# Patient Record
Sex: Female | Born: 1960 | Race: White | Hispanic: No | Smoking: Former smoker
Health system: Southern US, Community
[De-identification: ages and names within clinical notes are randomized; demographics above are authoritative.]

## PROBLEM LIST (undated history)

## (undated) DIAGNOSIS — N201 Calculus of ureter: Secondary | ICD-10-CM

## (undated) DIAGNOSIS — Z87442 Personal history of urinary calculi: Secondary | ICD-10-CM

## (undated) DIAGNOSIS — Z8719 Personal history of other diseases of the digestive system: Secondary | ICD-10-CM

## (undated) DIAGNOSIS — R112 Nausea with vomiting, unspecified: Secondary | ICD-10-CM

## (undated) DIAGNOSIS — Z973 Presence of spectacles and contact lenses: Secondary | ICD-10-CM

## (undated) DIAGNOSIS — Z9889 Other specified postprocedural states: Secondary | ICD-10-CM

## (undated) DIAGNOSIS — K219 Gastro-esophageal reflux disease without esophagitis: Secondary | ICD-10-CM

## (undated) HISTORY — PX: BREAST EXCISIONAL BIOPSY: SUR124

---

## 1998-05-13 ENCOUNTER — Ambulatory Visit (HOSPITAL_BASED_OUTPATIENT_CLINIC_OR_DEPARTMENT_OTHER): Admission: RE | Admit: 1998-05-13 | Discharge: 1998-05-13 | Payer: Self-pay | Admitting: General Surgery

## 1998-06-08 ENCOUNTER — Other Ambulatory Visit: Admission: RE | Admit: 1998-06-08 | Discharge: 1998-06-08 | Payer: Self-pay | Admitting: Obstetrics and Gynecology

## 1998-12-06 ENCOUNTER — Ambulatory Visit (HOSPITAL_COMMUNITY): Admission: RE | Admit: 1998-12-06 | Discharge: 1998-12-06 | Payer: Self-pay | Admitting: Gastroenterology

## 1999-05-01 ENCOUNTER — Encounter: Admission: RE | Admit: 1999-05-01 | Discharge: 1999-05-01 | Payer: Self-pay | Admitting: Obstetrics and Gynecology

## 1999-05-01 ENCOUNTER — Encounter: Payer: Self-pay | Admitting: Obstetrics and Gynecology

## 1999-06-13 ENCOUNTER — Other Ambulatory Visit: Admission: RE | Admit: 1999-06-13 | Discharge: 1999-06-13 | Payer: Self-pay | Admitting: Obstetrics and Gynecology

## 2000-05-13 ENCOUNTER — Encounter: Payer: Self-pay | Admitting: General Surgery

## 2000-05-13 ENCOUNTER — Encounter: Admission: RE | Admit: 2000-05-13 | Discharge: 2000-05-13 | Payer: Self-pay | Admitting: General Surgery

## 2000-06-13 ENCOUNTER — Other Ambulatory Visit: Admission: RE | Admit: 2000-06-13 | Discharge: 2000-06-13 | Payer: Self-pay | Admitting: Obstetrics and Gynecology

## 2001-04-01 ENCOUNTER — Encounter: Admission: RE | Admit: 2001-04-01 | Discharge: 2001-05-21 | Payer: Self-pay | Admitting: Orthopedic Surgery

## 2001-05-19 ENCOUNTER — Encounter: Payer: Self-pay | Admitting: Obstetrics and Gynecology

## 2001-05-19 ENCOUNTER — Encounter: Admission: RE | Admit: 2001-05-19 | Discharge: 2001-05-19 | Payer: Self-pay | Admitting: Obstetrics and Gynecology

## 2001-06-18 ENCOUNTER — Other Ambulatory Visit: Admission: RE | Admit: 2001-06-18 | Discharge: 2001-06-18 | Payer: Self-pay | Admitting: Obstetrics and Gynecology

## 2002-05-20 ENCOUNTER — Encounter: Admission: RE | Admit: 2002-05-20 | Discharge: 2002-05-20 | Payer: Self-pay | Admitting: Obstetrics and Gynecology

## 2002-05-20 ENCOUNTER — Encounter: Payer: Self-pay | Admitting: Obstetrics and Gynecology

## 2002-05-26 ENCOUNTER — Encounter: Payer: Self-pay | Admitting: Obstetrics and Gynecology

## 2002-05-26 ENCOUNTER — Encounter: Admission: RE | Admit: 2002-05-26 | Discharge: 2002-05-26 | Payer: Self-pay | Admitting: Obstetrics and Gynecology

## 2002-05-26 ENCOUNTER — Encounter (INDEPENDENT_AMBULATORY_CARE_PROVIDER_SITE_OTHER): Payer: Self-pay | Admitting: Specialist

## 2002-06-12 ENCOUNTER — Encounter (INDEPENDENT_AMBULATORY_CARE_PROVIDER_SITE_OTHER): Payer: Self-pay | Admitting: Specialist

## 2002-06-12 ENCOUNTER — Ambulatory Visit (HOSPITAL_COMMUNITY): Admission: RE | Admit: 2002-06-12 | Discharge: 2002-06-12 | Payer: Self-pay | Admitting: General Surgery

## 2002-06-12 HISTORY — PX: OTHER SURGICAL HISTORY: SHX169

## 2002-07-03 ENCOUNTER — Other Ambulatory Visit: Admission: RE | Admit: 2002-07-03 | Discharge: 2002-07-03 | Payer: Self-pay | Admitting: Obstetrics and Gynecology

## 2002-09-29 ENCOUNTER — Other Ambulatory Visit: Admission: RE | Admit: 2002-09-29 | Discharge: 2002-09-29 | Payer: Self-pay | Admitting: Obstetrics and Gynecology

## 2003-03-24 ENCOUNTER — Other Ambulatory Visit: Admission: RE | Admit: 2003-03-24 | Discharge: 2003-03-24 | Payer: Self-pay | Admitting: Obstetrics and Gynecology

## 2003-05-24 ENCOUNTER — Encounter: Admission: RE | Admit: 2003-05-24 | Discharge: 2003-05-24 | Payer: Self-pay | Admitting: General Surgery

## 2003-07-06 ENCOUNTER — Other Ambulatory Visit: Admission: RE | Admit: 2003-07-06 | Discharge: 2003-07-06 | Payer: Self-pay | Admitting: Obstetrics and Gynecology

## 2004-01-24 ENCOUNTER — Encounter: Admission: RE | Admit: 2004-01-24 | Discharge: 2004-01-24 | Payer: Self-pay | Admitting: Orthopedic Surgery

## 2004-05-29 ENCOUNTER — Encounter: Admission: RE | Admit: 2004-05-29 | Discharge: 2004-05-29 | Payer: Self-pay | Admitting: Obstetrics and Gynecology

## 2004-06-07 ENCOUNTER — Encounter: Admission: RE | Admit: 2004-06-07 | Discharge: 2004-06-07 | Payer: Self-pay | Admitting: Internal Medicine

## 2004-07-07 ENCOUNTER — Other Ambulatory Visit: Admission: RE | Admit: 2004-07-07 | Discharge: 2004-07-07 | Payer: Self-pay | Admitting: Obstetrics and Gynecology

## 2005-05-07 ENCOUNTER — Encounter: Admission: RE | Admit: 2005-05-07 | Discharge: 2005-05-07 | Payer: Self-pay | Admitting: Obstetrics and Gynecology

## 2005-07-11 ENCOUNTER — Other Ambulatory Visit: Admission: RE | Admit: 2005-07-11 | Discharge: 2005-07-11 | Payer: Self-pay | Admitting: Obstetrics and Gynecology

## 2006-05-31 ENCOUNTER — Encounter: Admission: RE | Admit: 2006-05-31 | Discharge: 2006-05-31 | Payer: Self-pay | Admitting: Obstetrics and Gynecology

## 2006-07-31 ENCOUNTER — Encounter: Admission: RE | Admit: 2006-07-31 | Discharge: 2006-07-31 | Payer: Self-pay | Admitting: Dermatology

## 2006-09-17 ENCOUNTER — Ambulatory Visit: Payer: Self-pay | Admitting: Sports Medicine

## 2006-09-17 DIAGNOSIS — M7660 Achilles tendinitis, unspecified leg: Secondary | ICD-10-CM | POA: Insufficient documentation

## 2006-10-22 ENCOUNTER — Ambulatory Visit: Payer: Self-pay | Admitting: Sports Medicine

## 2006-10-22 DIAGNOSIS — M722 Plantar fascial fibromatosis: Secondary | ICD-10-CM

## 2006-10-23 ENCOUNTER — Telehealth (INDEPENDENT_AMBULATORY_CARE_PROVIDER_SITE_OTHER): Payer: Self-pay | Admitting: *Deleted

## 2006-11-22 ENCOUNTER — Ambulatory Visit: Payer: Self-pay | Admitting: Sports Medicine

## 2007-03-12 ENCOUNTER — Encounter: Payer: Self-pay | Admitting: Sports Medicine

## 2007-06-03 ENCOUNTER — Encounter: Admission: RE | Admit: 2007-06-03 | Discharge: 2007-06-03 | Payer: Self-pay | Admitting: Obstetrics and Gynecology

## 2008-04-02 ENCOUNTER — Ambulatory Visit (HOSPITAL_BASED_OUTPATIENT_CLINIC_OR_DEPARTMENT_OTHER): Admission: RE | Admit: 2008-04-02 | Discharge: 2008-04-02 | Payer: Self-pay | Admitting: General Surgery

## 2008-04-02 HISTORY — PX: OTHER SURGICAL HISTORY: SHX169

## 2008-06-03 ENCOUNTER — Encounter: Admission: RE | Admit: 2008-06-03 | Discharge: 2008-06-03 | Payer: Self-pay | Admitting: Obstetrics and Gynecology

## 2008-09-03 ENCOUNTER — Encounter: Admission: RE | Admit: 2008-09-03 | Discharge: 2008-09-03 | Payer: Self-pay | Admitting: Internal Medicine

## 2009-06-06 ENCOUNTER — Encounter: Admission: RE | Admit: 2009-06-06 | Discharge: 2009-06-06 | Payer: Self-pay | Admitting: Obstetrics and Gynecology

## 2009-11-18 ENCOUNTER — Ambulatory Visit (HOSPITAL_BASED_OUTPATIENT_CLINIC_OR_DEPARTMENT_OTHER): Admission: RE | Admit: 2009-11-18 | Discharge: 2009-11-18 | Payer: Self-pay | Admitting: General Surgery

## 2009-12-15 ENCOUNTER — Encounter: Payer: Self-pay | Admitting: Sports Medicine

## 2009-12-26 ENCOUNTER — Encounter: Payer: Self-pay | Admitting: Sports Medicine

## 2010-01-05 ENCOUNTER — Ambulatory Visit: Payer: Self-pay | Admitting: Sports Medicine

## 2010-01-05 DIAGNOSIS — M25579 Pain in unspecified ankle and joints of unspecified foot: Secondary | ICD-10-CM

## 2010-01-05 DIAGNOSIS — R269 Unspecified abnormalities of gait and mobility: Secondary | ICD-10-CM

## 2010-02-15 ENCOUNTER — Ambulatory Visit: Payer: Self-pay | Admitting: Sports Medicine

## 2010-03-01 ENCOUNTER — Ambulatory Visit: Payer: Self-pay | Admitting: Sports Medicine

## 2010-03-01 DIAGNOSIS — M79609 Pain in unspecified limb: Secondary | ICD-10-CM | POA: Insufficient documentation

## 2010-03-30 ENCOUNTER — Encounter: Payer: Self-pay | Admitting: Sports Medicine

## 2010-04-03 ENCOUNTER — Ambulatory Visit: Payer: Self-pay | Admitting: Sports Medicine

## 2010-04-13 ENCOUNTER — Encounter: Payer: Self-pay | Admitting: Sports Medicine

## 2010-05-03 ENCOUNTER — Ambulatory Visit
Admission: RE | Admit: 2010-05-03 | Discharge: 2010-05-03 | Payer: Self-pay | Source: Home / Self Care | Attending: Sports Medicine | Admitting: Sports Medicine

## 2010-05-06 ENCOUNTER — Encounter: Payer: Self-pay | Admitting: Obstetrics and Gynecology

## 2010-05-08 ENCOUNTER — Other Ambulatory Visit: Payer: Self-pay | Admitting: Obstetrics and Gynecology

## 2010-05-08 DIAGNOSIS — Z1239 Encounter for other screening for malignant neoplasm of breast: Secondary | ICD-10-CM

## 2010-05-18 NOTE — Assessment & Plan Note (Signed)
Summary: F/U FEET,MC   Vital Signs:  Patient profile:   50 year old female BP sitting:   134 / 87  (right arm)  Vitals Entered By: Rochele Pages RN (April 03, 2010 9:22 AM)  History of Present Illness: Pt was seen at [facility] for R sided foot pain with concern for plantar fasciitis 03/30/10. Was referred back to [facility] for possible u/s evalaution of Lt plantar fascia vs calcaneal stress fx. Pt reports significant pain in R foot over last 1-2 months. Has been doing signifcant circuit training and reports progressive worsening in pain over this time period. Painmost prominent on post dorsum of foot. Most prominent with heel striking per pt. Pain severe over last 1-2 weeks per pt. Unrelieved with high dose aleve/NSAIDs. Pain currently 6-10. has been as worse as 9-10/10. Pain significant enough to stop daily exercise per pt. L sided plantar fasciitis resolved per pt. Orthotics and insoles have helped significantly.   Allergies: No Known Drug Allergies  Physical Exam  General:  alert and well-developed.   Msk:  Normal inspection with no visable or palpable fat pad atrophy and no visible swelling/erythema. Patient is tender at medial insertion of plantar fascia into calcaneus. Great toe motion: tenderness to palpation with dorsiflexion of toe  Arch shape: high Other foot breakdown: none   Additional Exam:  MSK [place] RT PF shows thickening on RT now with AP at 0.57 cms and this was only 0.44 on left when we first evaluated left PF left PF is now down to 0.53 with small prob spur RT has central area that is hypoechoic suggesting swelling and prob very small spur  images saved   Impression & Recommendations:  Problem # 1:  PLANTAR FASCIITIS, RIGHT (ICD-728.71)   Ultrasound indicative of >10% increase in plantar fascia width. R sided insoles changed to help with padding. Pt declining steroid injection as pt has had multiple steroid injections on L side with minimal improvement in pain. There is likely  an overuse component to symptoms as pt has minimal rest from full activities with L sided plantar fasciitis. Will rx ketoprofen gel and meloxicam.  Instructed to perform at approx 50% for next 2-4 weeks to help with sxs. Will followup in 4 weeks.  Her updated medication list for this problem includes:    Meloxicam 15 Mg Tabs (Meloxicam) .Marland Kitchen... Take 1 by mouth once daily with food  I think she has to give up boot camps and drills with lots of jumping activity this seems to really flare her sxs and then hurts with all running  also start std PF stretches and exercises xtrain until pain is < 3/10 reck 1 month  Orders: [place] LIMITED ([address])  Problem # 2:  PLANTAR FASCIITIS, LEFT (ICD-728.71)  Her updated medication list for this problem includes:    Meloxicam 15 Mg Tabs (Meloxicam) .Marland Kitchen... Take 1 by mouth once daily with food   This side has resolved based on sxs and looks better on [place]  copy visit to dr draper  Complete Medication List: 1)  Meloxicam 15 Mg Tabs (Meloxicam) .... Take 1 by mouth once daily with food 2)  Ketoprofen 20% Gel  .... Apply to affected area 4 times per day Prescriptions: MELOXICAM 15 MG TABS (MELOXICAM) Take 1 by mouth once daily with food  #30 x 1   Entered by:   Rochele Pages RN   Authorized by:   Enid Baas MD   Signed by:   Rochele Pages RN on 04/03/2010  Method used:   Electronically to        [facility]* (retail)       [address]       [place], [place]  [id number]       Ph: [phone]       Fax: [phone]   RxID:   [id number] KETOPROFEN 20% GEL Apply to affected area 4 times per day  #60 grams x PRN   Entered by:   Rochele Pages RN   Authorized by:   Enid Baas MD   Signed by:   Rochele Pages RN on 04/03/2010   Method used:   Print then Give to Patient   RxID:   [id number]    Orders Added: 1)  Est. Patient Level III [34742] 2)  [place] LIMITED [[id number]]

## 2010-05-18 NOTE — Assessment & Plan Note (Signed)
Summary: ADJUST ORTHOTICS,MC   Vital Signs:  Patient profile:   50 year old female Pulse rate:   78 / minute BP sitting:   135 / 86  (right arm)  Vitals Entered By: Rochele Pages RN (March 01, 2010 8:37 AM) CC: rt heel pain with orthotics   CC:  rt heel pain with orthotics.  History of Present Illness: 50 yo F f/u orthotics.  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  Last visit, we placed scaphoid pad and 1st ray post on Lt orthotic.  Tried for 1 week, but stopped wearing. She states cannot wear her current orthotics 2/2 them feeling like they are coming out of her shoes, in addition they are creating severe Rt heel pain that she thinks is PF, worse in AM, worse as she is on it all day.  Doing some PF stretches and ice bottle rolls. Would like to have orthotics adjusted again. Currently wearing Janith Lima Wave shoes, which have a fairly rigid base. In addition, in past we have shaven down her orthotic bases alot so they fit better in her shoes, however, it leaves little cushion on heels, particularly on Rt orthotic.  Preventive Screening-Counseling & Management  Alcohol-Tobacco     Smoking Status: quit > 6 months  Allergies: No Known Drug Allergies  Social History: Smoking Status:  quit > 6 months  Physical Exam  General:  Well-developed,well-nourished,in no acute distress; alert,appropriate and cooperative throughout examination Msk:  Foot inspection and palpation reveals breakdown of the transverse arch and a drop of MT heads:2-3 b/l Abnormal callous is present: no Hammer toes: no TTP: Rt heel in fat pad region B/l severe cavus feet Gait: mild ankle pronation with walking  MSK [place] PF: Rt PF 0.47 cm thickness with heel spur, Lt PF 0.62 cm with calcification.    Impression & Recommendations:  Problem # 1:  HEEL PAIN, RIGHT (ICD-729.5)  Feel this is mostly fat pad bruising over PF because of loss of base on orthotic.  In addition, has a very hard and rigid  soled shoe - pt to go back to [employer] for softer EVA base shoe - trial of arch straps - added extra heel pads to b/l orthotics - gave her new pair of green insoles with b/l heel pads and Lt sided 1st ray post and scaphoid pad - pt will do trial of orthotics vs green insoles - continue doing PF stretches, though likely not true PF as cause of pain - f/u 1 month - may be candidate for thinner diabetic neuropathy insoles at some point  Orders: Foot Orthosis ( Arch Strap/Heel Cup) [phone]) Sports Insoles [phone])  Problem # 2:  ABNORMALITY OF GAIT (ICD-781.2)  cavus feet, ankle pronation and wobble, and transv arch breakdown - Lt ankle doing great - other changes as above - f/u 1 month  Orders: Foot Orthosis ( Arch Strap/Heel Cup) [phone]) Sports Insoles [phone])   Orders Added: 1)  Est. Patient Level III [29562] 2)  Foot Orthosis ( Arch Strap/Heel Cup) [Z3086] 3)  Sports Insoles [L3510]

## 2010-05-18 NOTE — Consult Note (Signed)
Summary: [facility]  [facility]   Imported By: Marily Memos 03/31/2010 08:55:19  _____________________________________________________________________  External Attachment:    Type:   Image     Comment:   External Document

## 2010-05-18 NOTE — Assessment & Plan Note (Signed)
Summary: f/u feet,mc   Vital Signs:  Patient profile:   50 year old female Pulse rate:   92 / minute BP sitting:   123 / 87  (left arm)  Vitals Entered By: Lillia Pauls CMA (May 03, 2010 3:00 PM) CC: f/u rt foot    CC:  f/u rt foot .  History of Present Illness: 50 yo F h/o cavus feet and b/l PF here to f/u on Rt Plantar fasciitis.  Had CSI by Draper 3 weeks ago on Rt PF, is doing better but still with some pain.  Mostly notices with pounding on heels, even with jumping jacks.  Has stopped doing boot camps.  Endorses doing her PF stretches and eccentric heel drops/lifts. Would also like some new orthotics.  Does not like her old red ones.  Preventive Screening-Counseling & Management  Alcohol-Tobacco     Smoking Status: quit  Allergies: No Known Drug Allergies  Social History: Smoking Status:  quit  Physical Exam  General:  Well-developed,well-nourished,in no acute distress; alert,appropriate and cooperative throughout examination Msk:  Lt foot: pes cavus.  No ttp at PF insertion.  No transv arch.  No MT head pain.  Rt foot: pes cavus.  + ttp at PF insertion.  + palpable thick distal PF as well.  no transv arch breakdown.  No MT head pain  Nl post tib function b/l  [place]: Lt PF 0.49 cm with heel spur.  Rt PF 0.53 cm with some mild fiber disruption and small microtears.  minimally increased blood flow. Neurologic:  alert & oriented X3.     Impression & Recommendations:  Problem # 1:  PLANTAR FASCIITIS, RIGHT (ICD-728.71)  Persistent but improved following injection by Margaretha Sheffield 3 weeks ago - continue her home rehab consisting of ball/ice bottle heel rolls, PF stretches, and eccentric heel drops/lifts - orthotics today, see below - have recommended she avoid all high impact activities for next several weeks to give it time to heal, ok for swimming, elliptical and recumbent bike.  Gradually work back into running. - f/u 6-8 weeks  Her updated medication list for this  problem includes:    Meloxicam 15 Mg Tabs (Meloxicam) .Marland Kitchen... Take 1 by mouth once daily with food  Orders: [place] LIMITED (30865) Orthotic Materials, each unit (H8469)  Problem # 2:  FOOT PAIN, BILATERAL (ICD-729.5)  Stems from some intermittent chronic plantar fasciitis and significant b/l pes cavus that puts stress on her feet - custom orthotics today  Patient was fitted for a : standard, cushioned, semi-rigid orthotic. The orthotic was heated and afterward the patient stood on the orthotic blank positioned on the orthotic stand. The patient was positioned in subtalar neutral position and 10 degrees of ankle dorsiflexion in a weight bearing stance. After completion of molding, a stable base was applied to the orthotic blank. The blank was ground to a stable position for weight bearing. Size: 7 blue swirl Base: blue EVA Posting: none Additional orthotic padding: none  Tried prior to leaving and felt comfortable.  She previously had Lt sided 1st ray post on prior orthotic and green insoles, I think this was mostly for ankle wobble control and possibly some mild hallux rigidus.  She preferred to not add it at this time, and will f/u if thinks it is necessary.  F/u 6-8 weeks once she has gotten back into her normal physical activities to ensure these are doing well  Orders: [place] LIMITED (62952) Orthotic Materials, each unit (W4132)  Complete Medication List: 1)  Meloxicam 15 Mg Tabs (Meloxicam) .... Take 1 by mouth once daily with food 2)  Ketoprofen 20% Gel  .... Apply to affected area 4 times per day   Orders Added: 1)  Est. Patient Level IV [[address]] 2)  [place] LIMITED [60454] 3)  Orthotic Materials, each unit [L3002]

## 2010-05-18 NOTE — Consult Note (Signed)
Summary: Consultation Report  Consultation Report   Imported By: Marily Memos 12/26/2009 09:37:24  _____________________________________________________________________  External Attachment:    Type:   Image     Comment:   External Document

## 2010-05-18 NOTE — Assessment & Plan Note (Signed)
Summary: ORTHOTICS PER [facility],MC   Vital Signs:  Patient profile:   50 year old female Height:      66 inches Weight:      140 pounds BMI:     22.68 BP sitting:   121 / 81  Vitals Entered By: Lillia Pauls CMA (January 05, 2010 10:54 AM)  History of Present Illness: Patient sent f Dr Margaretha Sheffield spur on left med malleolus concern for stress fx but this R/O w MRI now gets pain along this area and Dr Margaretha Sheffield felt biomechanics may have been issue  we made her orthtoics 2+ yrs ago but she did not use for long as they felt too thick  pair made by podiatrist were too hard  now using a highly cushioned type shoe and feels some better w that  her Hx of left foot and leg issues dates at least 3 yrs. PF at first and that resolved but also had some AT issues that improved as well  running is not that extensive - 3 miles 3x per wk   Allergies: No Known Drug Allergies  Physical Exam  General:  Well-developed,well-nourished,in no acute distress; alert,appropriate and cooperative throughout examination Msk:  LEFT ankle shows no swelling; stable lateral and medial ligaments; squeeze test and kleiger test unremarkable; talar dome seems nontender; no sign of peroneal tendon subluxations; no pain at base of 5th MT.  note her pain is just above left lateral malleolus and ant but is not TTP  foot shows moderately preserved arch bilat  hip abduction and rotation are weak on left compared to RT other strength tests nl  Extremities:  Gait shows a rapid horizontal shift in left ankle w pronation  also dynamic genu valgus and IR of hip on left not present on RT  form is otherwise good   Impression & Recommendations:  Problem # 1:  ANKLE PAIN, LEFT (ICD-719.47)  there is some spurring on XRAY so I am concerned w developement of DJD if we do not change form  has orthotics but I will ask her to return to modify these once we try temp changes to take pressure off ankle  Orders: Sports  Insoles (L3510)  Problem # 2:  ABNORMALITY OF GAIT (ICD-781.2)  significant gait change on left  work strength and given hip series to do w step up/ downa nd xover  looks improved gait at ankle but not knee w sports insoles/ scaphoid pad and 1st ray post on left only  trial for 1 mo  will RTC for reck w dr Margaretha Sheffield and myself  Orders: Sports Insoles [phone])

## 2010-05-18 NOTE — Assessment & Plan Note (Signed)
Summary: TO SEE FIELDS,F/U PER FIELDS,MC   Vital Signs:  Patient profile:   50 year old female BP sitting:   136 / 79  Vitals Entered By: Rochele Pages RN (February 15, 2010 3:02 PM)  History of Present Illness: 50 yo F f/u L ankle and foot pain.  Previously had MRI to r/o stress fx, still with some pain on distal tibia on  posteromedial aspect.  Known medial mall bone spur as well. Had sports insoles with 1st ray post and scaphoid pad placed on L insole at last visit, feels this is helping  her ankle pain alot. Still doing some running 1.5 miles per session, as well as doing boot camps.  Allergies: No Known Drug Allergies  Physical Exam  General:  Well-developed,well-nourished,in no acute distress; alert,appropriate and cooperative throughout examination Msk:  Feet: nl arches b/l, preserved transv arches  Ankle: No visible erythema or swelling. Range of motion is full in all directions. Strength is 5/5 in all directions. Stable lateral and medial ligaments; squeeze test and kleiger test unremarkable; Talar dome nontender; No pain at base of 5th MT; No tenderness over cuboid; No tenderness over N spot or navicular prominence No tenderness on posterior aspects of lateral and medial malleolus No sign of peroneal tendon subluxations; Negative tarsal tunnel tinel's Able to walk 4 steps. Pain located on medial aspect of distal tibia but really no TTP there.   Pulses:  R posterior tibial normal, R dorsalis pedis normal, L posterior tibial normal, and L dorsalis pedis normal.   Neurologic:  alert & oriented X3.     Impression & Recommendations:  Problem # 1:  ABNORMALITY OF GAIT (ICD-781.2) Today we adjusted her old orthotics we had previously made by adding to left orthotic 1st ray post and modified scaphoid pad on L insole.  This greatly helped her pain, ankle wobble, and pronation.  She had normal running gait with orthotics - f/u prn  Problem # 2:  ANKLE PAIN, LEFT  (ICD-719.47) May have mild distal shin splint, also has bone spur medially - orthotic adjustment as above - discussed shin splint stretches as well - f/u prn   Orders Added: 1)  Est. Patient Level III [16109]

## 2010-05-18 NOTE — Consult Note (Signed)
Summary: [facility]  [facility]   Imported By: Marily Memos 04/17/2010 10:27:13  _____________________________________________________________________  External Attachment:    Type:   Image     Comment:   External Document

## 2010-05-18 NOTE — Consult Note (Signed)
Summary: [facility]  [facility]   Imported By: Marily Memos 12/23/2009 11:17:33  _____________________________________________________________________  External Attachment:    Type:   Image     Comment:   External Document

## 2010-06-07 ENCOUNTER — Ambulatory Visit
Admission: RE | Admit: 2010-06-07 | Discharge: 2010-06-07 | Disposition: A | Discharge status: Home / Self Care | Payer: 59 | Source: Ambulatory Visit | Attending: Obstetrics and Gynecology | Admitting: Obstetrics and Gynecology

## 2010-06-07 DIAGNOSIS — Z1239 Encounter for other screening for malignant neoplasm of breast: Secondary | ICD-10-CM

## 2010-06-14 ENCOUNTER — Encounter: Payer: Self-pay | Admitting: Family Medicine

## 2010-06-14 ENCOUNTER — Ambulatory Visit (INDEPENDENT_AMBULATORY_CARE_PROVIDER_SITE_OTHER): Payer: 59 | Admitting: Family Medicine

## 2010-06-14 ENCOUNTER — Other Ambulatory Visit: Payer: Self-pay | Admitting: Sports Medicine

## 2010-06-14 DIAGNOSIS — M722 Plantar fascial fibromatosis: Secondary | ICD-10-CM

## 2010-06-14 DIAGNOSIS — M79671 Pain in right foot: Secondary | ICD-10-CM

## 2010-06-14 DIAGNOSIS — M79609 Pain in unspecified limb: Secondary | ICD-10-CM

## 2010-06-15 ENCOUNTER — Encounter (INDEPENDENT_AMBULATORY_CARE_PROVIDER_SITE_OTHER): Payer: Self-pay | Admitting: *Deleted

## 2010-06-19 ENCOUNTER — Ambulatory Visit
Admission: RE | Admit: 2010-06-19 | Discharge: 2010-06-19 | Disposition: A | Discharge status: Home / Self Care | Payer: 59 | Source: Ambulatory Visit | Attending: Sports Medicine | Admitting: Sports Medicine

## 2010-06-19 DIAGNOSIS — M79671 Pain in right foot: Secondary | ICD-10-CM

## 2010-06-22 NOTE — Miscellaneous (Signed)
  Clinical Lists Changes  Orders: Added new Test order of MRI without Contrast (MRI w/o Contrast) - Signed 

## 2010-06-22 NOTE — Miscellaneous (Signed)
Summary: PA # for MRI  Clinical Lists Changes  PA # for MRI of lower extremity- r/o stress fx of rt heel  - CC [phone]

## 2010-06-22 NOTE — Assessment & Plan Note (Signed)
Summary: FU /MC/MJD   History of Present Illness: 50 yo F f/u Rt PF.  Stopped all activities for last several weeks.  Still with persistent heel pain after injection and orthotics.  Still feels very tight after certain activties. Unable to even do jumping jacks.  Is having some night time pain. New orthotics from last visit doing ok, but cause occasional burning  Allergies: No Known Drug Allergies  Physical Exam  General:  Well-developed,well-nourished,in no acute distress; alert,appropriate and cooperative throughout examination Msk:  Feet: b/l cavus  Rt heel: + ttp over PF insertion, but also + squeeze test of heel.  No AT ttp.  No medial ankle ttp.  Mild pain with tap test.  MSK [place]: RT PF diameter of 0.64 cm with some sig edema and some partial tearing at insertion.  + mild doppler flow at insertion of PF onto calcaneus. Brief long view of calc without cortical irregularity or edema. Lt PF 0.46 cm Neurologic:  alert & oriented X3.     Impression & Recommendations:  Problem # 1:  PLANTAR FASCIITIS, RIGHT (ICD-728.71)  still present on exam and [place], but concerning that is so resistant to treatment, must r/o other causes see below - continue current treatment with stretches, orthotics, icing, etc.  Her updated medication list for this problem includes:    Meloxicam 15 Mg Tabs (Meloxicam) .Marland Kitchen... Take 1 by mouth once daily with food  Orders: [place] LIMITED ([address]109)  Problem # 2:  HEEL PAIN, RIGHT (ICD-729.5) Discussed with Margaretha Sheffield, concern for possible calcaneal stress fracture - MRI heel to r/o stress fx - will call pt with results and make appropriate f/u  Orders: MRI with Contrast (MRI w/Contrast) [place] LIMITED (60454)  Complete Medication List: 1)  Meloxicam 15 Mg Tabs (Meloxicam) .... Take 1 by mouth once daily with food 2)  Ketoprofen 20% Gel  .... Apply to affected area 4 times per day  Patient Instructions: 1)  MRI APPT IS ON MON 3/5 AT 12:30PM AT [address], [place]  IMAGING SITE. [phone]   Orders Added: 1)  MRI with Contrast [MRI w/Contrast] 2)  [place] LIMITED [76882] 3)  Est. Patient Level III [09811]

## 2010-06-28 ENCOUNTER — Encounter: Payer: Self-pay | Admitting: Family Medicine

## 2010-06-28 ENCOUNTER — Ambulatory Visit (INDEPENDENT_AMBULATORY_CARE_PROVIDER_SITE_OTHER): Payer: 59 | Admitting: Family Medicine

## 2010-06-28 DIAGNOSIS — M79609 Pain in unspecified limb: Secondary | ICD-10-CM

## 2010-07-04 NOTE — Assessment & Plan Note (Signed)
Summary: F/U/LP   History of Present Illness: 50 yo F f/u Rt foot pain. Here to review MRI Rt heel results.  Only doing biking right now.  No longer doing weight bearing exercise very much.  Got some heel inserts which help.  Allergies: No Known Drug Allergies  Physical Exam  General:  Well-developed,well-nourished,in no acute distress; alert,appropriate and cooperative throughout examination Msk:  Rt foot: Mild ttp PF origin.  No swelling. Neurologic:  alert & oriented X3.     Impression & Recommendations:  Problem # 1:  FOOT PAIN, BILATERAL (ICD-729.5) Assessment Unchanged MRI of Rt foot reviewed, showed effusions in calcaneal-cuboid joint, ankle joint, and subtalar joint with mild plantar fasciitis and peroneal/post tib tenosynovitis.  NO calcaneal stress fx - 6 day sterapred dose pak followed by 10 days relafen 500 two times a day - given b/l presentation, check rheum w/u (cbc, cmp, RF, ANA, ESR, CRP, uric acid, anti-ccp) - ice heels/ankle daily - continue heel lifts - avoid high stress activities - f/u 2-3 weeks  Orders: Comp Met-[facility] (19147-82956) CBC-[facility] (21308) ANA-[facility] (65784-69629) Rheum Fact-[facility] (52841) CRP, high sensitivity-[facility] (32440-10272) Uric Acid-[facility] (53664-40347) Miscellaneous Lab Charge-[facility] (42595) Sed Rate (ESR)-[facility] (63875)  Complete Medication List: 1)  Meloxicam 15 Mg Tabs (Meloxicam) .... Take 1 by mouth once daily with food 2)  Ketoprofen 20% Gel  .... Apply to affected area 4 times per day 3)  Prednisone (pak) 5 Mg Tabs (Prednisone) .... Take as directed for 6 days 4)  Nabumetone 500 Mg Tabs (Nabumetone) .... Take 1 by mouth two times a day for 10 days Prescriptions: NABUMETONE 500 MG TABS (NABUMETONE) take 1 by mouth two times a day for 10 days  #30 x 0   Entered by:   Rochele Pages RN   Authorized by:   Corbin Ade MD   Signed by:   Rochele Pages RN on 06/28/2010   Method used:   Electronically to        [facility]* (retail)       [address]       [place], [place]  [address]       Ph: [phone] or [phone]       Fax: [phone]   RxID:   [id number] PREDNISONE (PAK) 5 MG TABS (PREDNISONE) take as directed for 6 days  #1 QS x 0   Entered by:   Rochele Pages RN   Authorized by:   Corbin Ade MD   Signed by:   Rochele Pages RN on 06/28/2010   Method used:   Electronically to        [facility]* (retail)       [address]       [place], [place]  [address]       Ph: [phone] or [phone]       Fax: [phone]   RxID:   [id number]    Orders Added: 1)  Est. Patient Level II [99212] 2)  Comp Met-[facility] [02585-27782] 3)  CBC-[facility] [85027] 4)  ANA-[facility] [42353-61443] 5)  Rheum Fact-[facility] [15400] 6)  CRP, high sensitivity-[facility] [86761-95093] 7)  Uric Acid-[facility] [84550-23180] 8)  Miscellaneous Lab Charge-[facility] [99999] 9)  Sed Rate (ESR)-[facility] [26712]

## 2010-07-19 ENCOUNTER — Encounter: Payer: Self-pay | Admitting: Family Medicine

## 2010-07-19 ENCOUNTER — Ambulatory Visit (INDEPENDENT_AMBULATORY_CARE_PROVIDER_SITE_OTHER): Payer: 59 | Admitting: Family Medicine

## 2010-07-19 VITALS — BP 130/80

## 2010-07-19 DIAGNOSIS — M79609 Pain in unspecified limb: Secondary | ICD-10-CM

## 2010-07-19 MED ORDER — NABUMETONE 750 MG PO TABS: 750.0000 mg | ORAL_TABLET | Freq: Every day | ORAL | Status: AC

## 2010-07-19 MED ORDER — PREDNISONE 5 MG PO KIT: PACK | ORAL | Status: DC

## 2010-07-19 NOTE — Progress Notes (Signed)
  Subjective:    Patient ID: Shannon Watts, female    DOB: 1960/06/18, 50 y.o.   MRN: [id number]  HPI [name] comes in today to followup on her right heel pain. She completed a 6 day steroid Dosepak last week, and is now doing Relafen 500 mg at bedtime only because it upsets her stomach. She states the [employer] helped improve her pain by 25-30%, but she still does have some nagging pain in her right heel. She has refrained from high impact activities, but she still getting pain when she does spinning cycle classes. She also comes in today to review her lab work, and these showed no evidence of rheumatologic disorder. She is doing some swimming for exercise. She denies color change, temperature change, or swelling in the right foot.  Review of Systems Denies fevers, sweats, chills, weight loss.    Objective:   Physical Exam Gen. appearance: Well-appearing female, no distress, very pleasant affect Right foot: Mild tenderness to palpation on the medial plantar heel region at the origin of the plantar fascia. She has no evidence of color change or swelling in her heel or ankle. Full range of motion at the ankle and all of her toes.       Assessment & Plan:  Persistent right heel pain with some improvement after prednisone Dosepak. She does have some known plantar fasciitis, but MRI shows some vague diffuse swelling in the ankle joint as well as some inflammation of multiple foot muscles. Discussed the possibility of RSD, but discussed with Dr. Margaretha Sheffield and still feel this is more of an overuse/inflammatory response to some of the boot camps she was doing last year. -Because of her good response, we will repeat the six-day Sterapred Dosepak -Refilled Relafen, but changed to 750 mg at bedtime only, #30, 2 refills  -discussed starting amitriptyline for possible RSD type syndrome, but she states that she was previously on this in the past for headaches and says it does work but is worried  that they will make her groggy and become addicted and she would like to avoid these for now if possible. -Continue to avoid high impact activities, and focus on swimming and other activities that do not provoke the pain in her heel -Followup in 4 weeks

## 2010-07-25 ENCOUNTER — Other Ambulatory Visit: Payer: Self-pay | Admitting: Obstetrics and Gynecology

## 2010-08-10 ENCOUNTER — Encounter: Payer: Self-pay | Admitting: Sports Medicine

## 2010-08-23 ENCOUNTER — Ambulatory Visit: Payer: 59 | Admitting: Family Medicine

## 2010-08-29 NOTE — Op Note (Signed)
Shannon Watts, DIX              ACCOUNT NO.:  [id number]   MEDICAL RECORD NO.:  [id number]          PATIENT TYPE:  AMB   LOCATION:  DSC                          FACILITY:  [facility]   PHYSICIAN:  Juanetta Gosling, MDDATE OF BIRTH:  11/13/1960   DATE OF PROCEDURE:  04/02/2008  DATE OF DISCHARGE:                               OPERATIVE REPORT   PREOPERATIVE. DIAGNOSIS:  Anal polyp.   POSTOPERATIVE DIAGNOSIS:  Anal polyp.   PROCEDURE:  1. Exam under anesthesia.  2. Excision of anal polyp.   SURGEON:  Juanetta Gosling, MD   ASSISTANT:  None.   ANESTHESIA:  General.   FINDINGS:  Small anal polyp near the anal verge.   ESTIMATED BLOOD LOSS:  Minimal.   DRAINS:  None.   COMPLICATIONS:  None.   DISPOSITION:  To pathology.  To recovery room in a stable condition.   HISTORY:  Ms. Kasprzak is a 50 year old female with the prior history of a  sphincterotomy for an anal fissure by Dr. Earlene Plater.  She has had a history  of polyps, undergoes colonoscopy with Dr. Loreta Ave, every 5 years per her  report.  She describes some symptoms of perineal irritation with very  occasional spots of red blood and pain especially with the hard bowel  movement.  She also describes, it feels like a nodule in her rectum as  well.  On her exam, she had a small nodule palpable on digital rectal  exam that we discussed biopsying of.   PROCEDURE:  After informed consent was obtained, the patient was taken  to the operating room.  She was placed under general anesthesia without  complication, laid prone and appropriately padded.  Her buttocks were  taped apart.  Following this, a surgical time out was then performed.   An exam under anesthesia with the anoscope was performed.  She had some  small internal hemorrhoids, but was otherwise normal except for a polyp  in the 7 o'clock position near the anal verge.  This was excised with  the  electrocautery and oversewn with a 3-0 chromic suture.  Hemostasis was  observed.  A piece of Gelfoam was then inserted into the rectum.  She  tolerated this well and was extubated in the operating room and was  transferred to the recovery room in stable condition.      Juanetta Gosling, MD  Electronically Signed     MCW/MEDQ  D:  04/02/2008  T:  04/03/2008  Job:  [id number]

## 2010-09-01 NOTE — Op Note (Signed)
NAME:  Shannon Watts, Shannon Watts                        ACCOUNT NO.:  [id number]   MEDICAL RECORD NO.:  [id number]                   PATIENT TYPE:  AMB   LOCATION:  DAY                                  FACILITY:  [facility]   PHYSICIAN:  Timothy E. Earlene Plater, M.D.              DATE OF BIRTH:  01-02-1961   DATE OF PROCEDURE:  06/12/2002  DATE OF DISCHARGE:                                 OPERATIVE REPORT   PREOPERATIVE DIAGNOSES:  1. Right breast mass.  2. Chronic, recurrent anal fissure.   POSTOPERATIVE DIAGNOSES:  1. Right breast mass.  2. Chronic, recurrent anal fissure.   PROCEDURES:  1. Excisional biopsy of mass, right breast.  2. Anorectal ultrasonography and repair of anal fissure.   SURGEON:  Timothy E. Earlene Plater, M.D.   ANESTHESIA:  General.   INDICATIONS:  Ms. Stribling is a long-term patient of mine.  She is now 82,  otherwise healthy.  She does have a problem with chronic, intermittent  constipation.  She has had many episodes of anal fissures.  She has had one  repair several years ago by standard procedure with internal sphincterotomy.  The fissure has recurred now. 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  We have discussed  that in detail.  Also, a recently discovered right breast mass was detected  and is visible, both on mammography ultrasonography.  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  The patient was identified, the permits signed, the  sites identified.   PROCEDURE IN DETAIL:  She was taken to the operating room and placed supine,  and LMA anesthesia was provided.  With the patient supine, the right breast  was prepped and draped in the usual fashion.  The mass was located at the  edge of the breast in the right lower outer quadrant, at approximately the 7  o'clock position, and it was easily palpable.  This area was injected with  0.25%  Marcaine with epinephrine.  A curvilinear incision was made in the  skin fold.  The subcutaneous tissue was dissected, the mass identified,  grasped, and completely excised.  Bleeding points were cauterized.  The  wound was dry.  It was closed in layers with 3-0 Vicryl and 3-0 Monocryl.  Steri-Strips were applied.  Counts were correct.  Specimen to the lab for  permanent sections.   The patient was stable.  She was then placed in the lithotomy position.  The  perianal area was inspected, prepped and draped in the usual fashion.  Ultrasonography was carried out to ensure that the sphincters were intact as  suspected because of her prior surgery, and this was accomplished with the  rectal ultrasound, and images revealed that, in fact, both the internal and  external sphincters were intact. Perhaps, the  internal sphincter was thicker  than we usually see it, but in any case, the sphincters were intact.  The  ultrasound was passed off the field.  The anus was injected round and about  with 0.25% Marcaine with epinephrine, gently massed, the anoscope inserted.  The fissure was posterior and was superficial and scarred.  It was well-  cauterized.  The scope removed.  Then, a carefully performed left posterior  percutaneous internal sphincterotomy accomplished.  This completed the  procedure.  There was no pathology, no complications.  Gelfoam gauze with a  dry, sterile dressing were applied.  She tolerated it well and was awakened  and taken to the recovery room in good condition.  All counts were correct.   Written and verbal instructions were given, including 30 of Percocet, 5 mg,  and she will be seen and followed as an outpatient.                                               Timothy E. Earlene Plater, M.D.    TED/MEDQ  D:  06/12/2002  T:  06/12/2002  Job:  [id number]

## 2010-09-06 ENCOUNTER — Ambulatory Visit: Payer: 59 | Admitting: Family Medicine

## 2010-09-20 ENCOUNTER — Ambulatory Visit (INDEPENDENT_AMBULATORY_CARE_PROVIDER_SITE_OTHER): Payer: 59 | Admitting: Family Medicine

## 2010-09-20 ENCOUNTER — Encounter: Payer: Self-pay | Admitting: Family Medicine

## 2010-09-20 VITALS — BP 133/87

## 2010-09-20 DIAGNOSIS — M79609 Pain in unspecified limb: Secondary | ICD-10-CM

## 2010-09-20 NOTE — Progress Notes (Signed)
  Subjective:    Patient ID: Shannon Watts, female    DOB: 12/26/1960, 50 y.o.   MRN: [id number]  HPI 50 year old female followup Rt heel pain and plantar fasciitis. Previously we have had a negative MRI for calcaneal stress fracture, negative rheumatologic workup, and we have also done 2 Sterapred dose pack tapers. She is currently doing Relafen at bedtime. She still having intermittent heel pain that is worse with activity. She did start doing boot camp again this morning, states it does hurt but also states her heel hurts all the time, and she loves boot camp so she states she will continue to do it.. She is using her orthotics. She admits is doing her stretches, but not icing as regularly as she should.   Review of Systems Denies fever, sweats, chills, weight loss    Objective:   Physical Exam Gen. appearance: Well-appearing female in no distress Rt foot: Positive tenderness directly at the origin of the plantar fascia the medial calcaneus. No Achilles tendon pain. She has good range of motion with dorsiflexion and plantarflexion.  MSK ultrasound of right heel: Plantar fascia thickness is 0.57 cm. She does have obvious calcaneal heel spur. No increased Doppler flow.       Assessment & Plan:  Persistent right plantar fasciitis, she states is about 20-25% better since she's been seeing [place] -Discussed repeat injection, she did not want to do that today. She'll follow up with Dr. Margaretha Sheffield in 2 days for repeat injection -continue her orthotics, stretches, and icing - continue Relafan bedtime as needed -I still think she needs to avoid high-impact activities, but she is pretty much decided she will do whatever she wants -If she is not significantly better in 6 weeks after this repeat injection, we'll consider referral to a foot surgeon for endoscopic plantar fascial release -We'll follow up with sports medicine as needed

## 2011-01-19 LAB — POCT HEMOGLOBIN-HEMACUE: Hemoglobin: 14.3 g/dL (ref 12.0–15.0)

## 2011-05-10 ENCOUNTER — Other Ambulatory Visit: Payer: Self-pay | Admitting: Obstetrics and Gynecology

## 2011-05-10 DIAGNOSIS — Z1231 Encounter for screening mammogram for malignant neoplasm of breast: Secondary | ICD-10-CM

## 2011-06-12 ENCOUNTER — Ambulatory Visit
Admission: RE | Admit: 2011-06-12 | Discharge: 2011-06-12 | Disposition: A | Discharge status: Home / Self Care | Payer: 59 | Source: Ambulatory Visit | Attending: Obstetrics and Gynecology | Admitting: Obstetrics and Gynecology

## 2011-06-12 DIAGNOSIS — Z1231 Encounter for screening mammogram for malignant neoplasm of breast: Secondary | ICD-10-CM

## 2011-07-29 ENCOUNTER — Ambulatory Visit: Payer: 59

## 2011-07-29 ENCOUNTER — Ambulatory Visit (INDEPENDENT_AMBULATORY_CARE_PROVIDER_SITE_OTHER): Payer: 59 | Admitting: Internal Medicine

## 2011-07-29 VITALS — BP 117/73 | HR 86 | Temp 97.5°F | Resp 16 | Ht 65.5 in | Wt 165.0 lb

## 2011-07-29 DIAGNOSIS — S93409A Sprain of unspecified ligament of unspecified ankle, initial encounter: Secondary | ICD-10-CM

## 2011-07-29 DIAGNOSIS — M25579 Pain in unspecified ankle and joints of unspecified foot: Secondary | ICD-10-CM

## 2011-07-29 DIAGNOSIS — M25572 Pain in left ankle and joints of left foot: Secondary | ICD-10-CM

## 2011-07-29 NOTE — Patient Instructions (Signed)
Elevate for the next 48 hours with ice for 20 minutes every 2 hours while wake Swede-O ankle brace/advance weight-bearing as tolerated Start rehabilitation exercises 4 days If not making marked improvement in 2 weeks call for physical therapy referral

## 2011-07-29 NOTE — Progress Notes (Signed)
  Subjective:    Patient ID: Shannon Watts, female    DOB: [birth year], 51 y.o.   MRN: [id number]  HPI Backing the car and stepped on a root inverting ankle Now swollen with pain on weightbearing History of prior injury the ankle   Review of Systems     Objective:   Physical ExamVital signs stable Left ankle is swollen moderately about the lateral malleolus There is tenderness along the distal fibula and the anterior talofibular ligament Inversion is painful Dorsiflexion is painful drawer is stable No tenderness proximal  fibula    [facility] reading (PRIMARY) by  Dr.Kiara Mcdowell= no new fx of fibula/old avulsions at both malleoli     Assessment & Plan:  Problem #1 ankle sprain lateral malleolus  Swedo Advance weight-bearing as tolerated Start rehabilitation exercise 4 days Elevate, ice every 2 hours today and tomorrow

## 2011-07-30 ENCOUNTER — Telehealth: Payer: Self-pay

## 2011-07-30 MED ORDER — MELOXICAM 15 MG PO TABS: 15.0000 mg | ORAL_TABLET | Freq: Every day | ORAL | Status: DC

## 2011-07-30 MED ORDER — CELECOXIB 200 MG PO CAPS: 200.0000 mg | ORAL_CAPSULE | Freq: Two times a day (BID) | ORAL | Status: AC

## 2011-07-30 NOTE — Telephone Encounter (Signed)
Pt requesting Celebrex instead of Mobic; able to get today without prior authorization per the pharmacist, so I agreed to discontinue the Mobic and prescribe Celebrex 200 mg #10 one po QD No refill.

## 2011-07-30 NOTE — Telephone Encounter (Signed)
Tell pt I spoke with Dr Merla Riches and he recommended Mobic 15 mg daily due to insurance reasons (likely would not be approved) and he feels this is completely safe.

## 2011-07-30 NOTE — Telephone Encounter (Signed)
PT SEEN WITH SPRAINED ANKLE,REQUESTING CELEBREX(SHE HAD COLONOSCOPY TODAY,POLYPS REMOVED) AND DR TOLD H ER THAT CELEBREX WOULD BE PERMISSIBLE TO TAKE AFTER THIS PROCEDURE.   BEST PHONE [phone]  [facility]  [place]

## 2011-09-19 ENCOUNTER — Ambulatory Visit (INDEPENDENT_AMBULATORY_CARE_PROVIDER_SITE_OTHER): Payer: 59 | Admitting: Sports Medicine

## 2011-09-19 VITALS — BP 130/85

## 2011-09-19 DIAGNOSIS — S82899A Other fracture of unspecified lower leg, initial encounter for closed fracture: Secondary | ICD-10-CM

## 2011-09-19 DIAGNOSIS — S82839A Other fracture of upper and lower end of unspecified fibula, initial encounter for closed fracture: Secondary | ICD-10-CM | POA: Insufficient documentation

## 2011-09-19 NOTE — Assessment & Plan Note (Signed)
Per u/s of fracture- callous forming well over site of fracture.  Pt can discontinue use of cam walker and use med spec brace with activity.  Can restart low impact activity- including slow spin biking and walking.  Pt to also start a calcium and vit d supplement daily. Pt to return in 3 weeks for recheck.  If progressing well can consider restart of running routine at that time.   Pt to return sooner if new or worsening of symptoms.

## 2011-09-19 NOTE — Progress Notes (Signed)
  Subjective:    Patient ID: Shannon Watts, female    DOB: [birth year], 51 y.o.   MRN: [id number]  HPI F/up left distal fibula avulsion fracture: Fracture occurred on 08/07/10 while trail running- tripped on root.  Was seen at [facility] urgent care on day of injury and was diagnosed with fracture and startedon celebrex.  Pt was seen by Dr. Margaretha Sheffield at [facility]-  Was placed in cam walker.  Had f/up appt on 5/16- and was instructed to continue using cam walker x 3 more weeks and to f/up in 3 weeks at sport's medicine clinic- at [facility].  Pt here today for this reason.  Pain has improved.  Has been using cam walker consistently except for 1 day - 1 week ago- took it off at the pool and did have some discomfort.  No discomfort as long as she uses the camwalker.  Was given med-spec brace to use as she returns to biking and other activities- has not used this yet.  Would like to progress back to exercise.  No redness. No swelling. No pain currently.     Review of Systems    as per above.  Objective:   Physical Exam  Constitutional: She appears well-developed.  Cardiovascular: Normal rate.   Pulmonary/Chest: Effort normal. No respiratory distress.  Musculoskeletal: She exhibits no edema.       Left ankle: No redness. No swelling. No effusion. Normal rom. No pain with rom. No pain with palpation. Normal strength in left ankle.     Musculoskeletal ultrasound There is a hard callus and good healing taking place along the left lateral malleolus There is no joint effusion or excessive fluid. The peroneal tendons look normal. On the medial side there is an ossicle that seems proximal to the posterior tibialis tendon that I suspect is an os tibialis        Assessment & Plan:

## 2011-09-19 NOTE — Patient Instructions (Signed)
Vit D 800units and Calcium 1200mg   supplement daily  Exercises- heel lifts, biking- slow spin, and walking Use med-spec brace.  Return in 3 weeks for recheck.

## 2011-10-08 ENCOUNTER — Ambulatory Visit (INDEPENDENT_AMBULATORY_CARE_PROVIDER_SITE_OTHER): Payer: 59 | Admitting: Sports Medicine

## 2011-10-08 VITALS — BP 133/85 | HR 82

## 2011-10-08 DIAGNOSIS — S82839A Other fracture of upper and lower end of unspecified fibula, initial encounter for closed fracture: Secondary | ICD-10-CM

## 2011-10-08 DIAGNOSIS — S82899A Other fracture of unspecified lower leg, initial encounter for closed fracture: Secondary | ICD-10-CM

## 2011-10-08 NOTE — Assessment & Plan Note (Signed)
This has taken longer as the avulsion actually extends from anterior to post malleolus so this is somewhere at border of distal malleolar vs avulsion injury  Healing is progressing steadily She can go to compression ankle brace  Begin balance exercises  Begin walk to run progression  Check back by Dr Margaretha Sheffield in 6 weeks to see if ankle stability has returned to 100%

## 2011-10-08 NOTE — Patient Instructions (Addendum)
Ok to start easy running at track or treadmill- run /walk for 20 minutes- do this for 3 sessions Then increase to 25 minutes for 3 sessions, then increase to 30 min for 1 week  Please do not push past mild pain  Please follow up in 4-6 weeks  Thank you for seeing Shannon Watts today!

## 2011-10-08 NOTE — Progress Notes (Signed)
  Subjective:    Patient ID: Shannon Watts, female    DOB: 21-Mar-1961, 51 y.o.   MRN: [id number]  HPI  Pt presents to clinic for f/u of lt distal fibular fx which she reports is 75% improved. She has been out of cam walker 3 weeks Used ASO at first and now in a genutrain Achilles brace Able to walk 20 to 30 mins w no pain Has not run yet  Review of Systems     Objective:   Physical Exam  Lt ankle: Mild swelling No pain with percussion or squeeze Slightly loose anteriotalofib ligament- 1+ anterior drawer Inversion stable, slightly less than rt Kleiger neg Medial ankle is non tender  MSK [place] We continue to see progressive healing of malleolar fracture There is almost complete callus over ant lat mall Smaller gap in mid malleolus Fx line barely visible in post malleolus No increase in doppler flow now Tendons intact     Assessment & Plan:

## 2011-11-05 ENCOUNTER — Ambulatory Visit (INDEPENDENT_AMBULATORY_CARE_PROVIDER_SITE_OTHER): Payer: 59 | Admitting: Sports Medicine

## 2011-11-05 VITALS — BP 120/81

## 2011-11-05 DIAGNOSIS — M25579 Pain in unspecified ankle and joints of unspecified foot: Secondary | ICD-10-CM

## 2011-11-05 NOTE — Progress Notes (Signed)
  Subjective:    Patient ID: Shannon Watts, female    DOB: Jan 13, 1961, 51 y.o.   MRN: [id number]  HPI chief complaint: Followup on rightt ankle pain  Patient comes in today for followup on her right ankle. She suffered an avulsion fracture off the distal fibula 3 months ago. She's doing well. Intermittent pain and swelling she is started to run again.    Review of Systems     Objective:   Physical Exam  Well-developed, well-nourished. No acute distress. Right ankle shows full range of motion. Minimal tenderness over the distal fibula. No effusion. No soft tissue swelling. Slight tenderness over the distal Achilles tendon. Neurovascular intact distally. Walking with minimal limp.  MSK ultrasound of the right ankle shows good bridging callus at the lateral malleolus consistent with a nearly healed fracture. Slight neovascularity.      Assessment & Plan:  #1. Clinically healing distal fibula avulsion fracture right ankle  Patient has a good understanding of a complete home exercise program. We will fit her with a body helix ankle wrap to wear with activity. She can increase activity as tolerated but will avoid exercises on uneven ground such as trail running. Followup in 6 weeks

## 2011-12-24 ENCOUNTER — Ambulatory Visit (INDEPENDENT_AMBULATORY_CARE_PROVIDER_SITE_OTHER): Payer: 59 | Admitting: Sports Medicine

## 2011-12-24 ENCOUNTER — Encounter: Payer: Self-pay | Admitting: Sports Medicine

## 2011-12-24 VITALS — BP 138/84 | HR 80 | Ht 65.5 in | Wt 165.0 lb

## 2011-12-24 DIAGNOSIS — M25579 Pain in unspecified ankle and joints of unspecified foot: Secondary | ICD-10-CM

## 2011-12-24 DIAGNOSIS — M7660 Achilles tendinitis, unspecified leg: Secondary | ICD-10-CM

## 2011-12-24 MED ORDER — DICLOFENAC SODIUM 1 % TD GEL: TRANSDERMAL | Status: DC

## 2011-12-24 NOTE — Progress Notes (Signed)
  Subjective:    Patient ID: Shannon Watts, female    DOB: [birth year], 51 y.o.   MRN: [id number]  HPI Shannon Asp comes in today for followup. Left ankle is 75% better. She is back to all activity. She is wearing her body helix x wrap on her ankle and she finds it quite comfortable. Only complaint today is some tightening of the Achilles tendon. No trauma.   Review of Systems     Objective:   Physical Exam Well-developed, well-nourished. No acute distress  Left ankle: 1+ anterior drawer, 1+ talar tilt, both of which are chronic. No tenderness to palpation over the distal fibula or at the ATF. Slight tenderness along the Achilles tendon but no thickening. Neurovascular intact distally. Walking without a limp.       Assessment & Plan:  Improving left ankle pain status post fibular avulsion fracture Mild Achilles tendinitis  Patient will start an Alfredsons eccentric heel drop program for her Achilles tendinitis. She will continue with her orthotics as well as her body helix x compression wrap. She can continue with activity as tolerated. She would like a refill on Voltaren gel as she has found helpful in the past. I've called in a refill for her today. She'll followup when necessary.

## 2012-05-16 ENCOUNTER — Other Ambulatory Visit: Payer: Self-pay | Admitting: Obstetrics and Gynecology

## 2012-05-16 DIAGNOSIS — Z1231 Encounter for screening mammogram for malignant neoplasm of breast: Secondary | ICD-10-CM

## 2012-06-13 ENCOUNTER — Ambulatory Visit
Admission: RE | Admit: 2012-06-13 | Discharge: 2012-06-13 | Disposition: A | Discharge status: Home / Self Care | Payer: 59 | Source: Ambulatory Visit | Attending: Obstetrics and Gynecology | Admitting: Obstetrics and Gynecology

## 2012-06-13 DIAGNOSIS — Z1231 Encounter for screening mammogram for malignant neoplasm of breast: Secondary | ICD-10-CM

## 2012-07-30 ENCOUNTER — Other Ambulatory Visit: Payer: Self-pay | Admitting: Obstetrics and Gynecology

## 2012-08-11 ENCOUNTER — Ambulatory Visit: Payer: 59 | Admitting: Sports Medicine

## 2012-11-24 ENCOUNTER — Ambulatory Visit (INDEPENDENT_AMBULATORY_CARE_PROVIDER_SITE_OTHER): Payer: 59 | Admitting: Sports Medicine

## 2012-11-24 VITALS — BP 149/84 | Ht 66.0 in | Wt 160.0 lb

## 2012-11-24 DIAGNOSIS — M7662 Achilles tendinitis, left leg: Secondary | ICD-10-CM

## 2012-11-24 DIAGNOSIS — M7660 Achilles tendinitis, unspecified leg: Secondary | ICD-10-CM

## 2012-11-24 NOTE — Progress Notes (Signed)
  Subjective:    Patient ID: Shannon Watts, female    DOB: 07-31-1960, 52 y.o.   MRN: [id number]  HPI  Patient is a 52 yo caucasian female who presents today complaining of left-sided Achilles tendon pain. Patient denies any acute injury to the area. Pain has been present for the past 6 months. Pain is localized directly to the achilles tendon, approximately 2cm proximal to the attachment to the calcaneous. She describes the pain as uncomfortable tightness of the calf, specifically during exercise. It should be noted that she was seen one year ago for an ankle fracture of the currently effected foot (left). Patient denies taking any medication for this pain/discomfort.   Ultrasound was performed to evaluate any soft tissue lesions or pathology of the patient's foot/ankle.  PMH  - Avulsion fracture of the distal fibula (Left)  - Plantar fasciitis (Left)  - Heel pain (right) Allergies: Amoxicillin (Hives) Meds:  - Xanax 0.25mg   - Voltaren 1% gel  - Ibuprofen 600mg   - Prednisone 5mg   - Valtrex 1000mg    Review of Systems Denies fever, chills, HA, cough, SOB    Objective:   Physical Exam General: patient is a pleasant 52yo female who appears of the stated age. She is alert, oriented, and in no acute distress Vitals: reviewed Ankle: Full ROM, no swelling/lesions/ecchymoses, sensation intact, muscle tone good, strength 5/5 bilaterally. Mild tenderness to palpation observed over the left achilles tendon near the musculotendinous junction. No instability observed. Mild/moderate pain with toe-walking. Thompson's test yielded evidence of intact achilles tendon.   Ultrasound:  Ultrasound of the left Achilles tendon was performed. Images were obtained in both long and short axis. There is thickening and hypoechoic changes at the musculotendinous junction consistent with Achilles tendinopathy at this level. Increased neovascularity as well.     Assessment & Plan:  1) Tendonopathy of the Left  Achilles Tendon  - Home exercise therapy: eccentric exercises and stretching  - Bilateral heel-lift inserts  -Patient is instructed to avoid explosive activity such as sprinting or jumping. The location of her tendinopathy places her at risk for Achilles tendon rupture. 2) Follow-up: 4 weeks  - If no/inaddequate progress: consider Nitroglycerine patch protocol

## 2012-12-25 ENCOUNTER — Ambulatory Visit: Payer: 59 | Admitting: Sports Medicine

## 2013-01-01 ENCOUNTER — Ambulatory Visit (INDEPENDENT_AMBULATORY_CARE_PROVIDER_SITE_OTHER): Payer: 59 | Admitting: Physician Assistant

## 2013-01-01 VITALS — BP 142/92 | HR 89 | Temp 98.7°F | Resp 16 | Ht 66.0 in | Wt 195.0 lb

## 2013-01-01 DIAGNOSIS — L539 Erythematous condition, unspecified: Secondary | ICD-10-CM

## 2013-01-01 DIAGNOSIS — M7989 Other specified soft tissue disorders: Secondary | ICD-10-CM

## 2013-01-01 LAB — POCT CBC
HCT, POC: 41.7 % (ref 37.7–47.9)
Hemoglobin: 13.2 g/dL (ref 12.2–16.2)
Lymph, poc: 2.8 (ref 0.6–3.4)
MCH, POC: 31.4 pg — AB (ref 27–31.2)
MCHC: 31.7 g/dL — AB (ref 31.8–35.4)
MPV: 8.9 fL (ref 0–99.8)
POC MID %: 7.9 %M (ref 0–12)
RBC: 4.21 M/uL (ref 4.04–5.48)
WBC: 6.4 10*3/uL (ref 4.6–10.2)

## 2013-01-01 MED ORDER — SULFAMETHOXAZOLE-TRIMETHOPRIM 800-160 MG PO TABS: 1.0000 | ORAL_TABLET | Freq: Two times a day (BID) | ORAL | Status: DC

## 2013-01-01 NOTE — Progress Notes (Signed)
Subjective:    Patient ID: Marguetta Windish, female    DOB: 09-10-1960, 52 y.o.   MRN: [id number]  HPI   Ms. Thomasene Lot is a very pleasant 52 yr old female here with concern for an insect bite to her right 5th toe.  Noted tenderness in the toe this AM when putting on a pair of boots.  Notes the toe has been red and tender today.  Does not recall an injury.  The only thing she can figure is that she was bitten by something.  States she previously had a spider bite to the left leg, but did not actually see the spider bite her.  She denies fever or chills.  No pain into the foot, leg, or other toes.  Able to move the toe and has full sensation.  The toe is tender to the touch.  No recent trauma to the area - did wear a new pair of shoes a few days ago but did not have symptoms at that time.     Review of Systems  Constitutional: Negative for fever and chills.  HENT: Negative.   Respiratory: Negative.   Cardiovascular: Negative.   Gastrointestinal: Negative.   Musculoskeletal: Positive for joint swelling and arthralgias.  Skin: Positive for color change.  Neurological: Negative.   All other systems reviewed and are negative.       Objective:   Physical Exam  Vitals reviewed. Constitutional: She is oriented to person, place, and time. She appears well-developed and well-nourished. No distress.  HENT:  Head: Normocephalic and atraumatic.  Eyes: Conjunctivae are normal. No scleral icterus.  Pulmonary/Chest: Effort normal.  Musculoskeletal:       Feet:  Right 5th toe mildly swollen and erythematous; TTP with deep palpation, not exquisitely tender; no warmth; no wound or drainage; no involvement of the foot or other toes; pulses normal; cap refill normal  Neurological: She is alert and oriented to person, place, and time. No sensory deficit.  Skin: Skin is warm and dry.  Psychiatric: She has a normal mood and affect. Her behavior is normal.     Results for orders placed in visit on  01/01/13  POCT CBC      Result Value Range   WBC 6.4  4.6 - 10.2 K/uL   Lymph, poc 2.8  0.6 - 3.4   POC LYMPH PERCENT 43.9  10 - 50 %L   MID (cbc) 0.5  0 - 0.9   POC MID % 7.9  0 - 12 %M   POC Granulocyte 3.1  2 - 6.9   Granulocyte percent 48.2  37 - 80 %G   RBC 4.21  4.04 - 5.48 M/uL   Hemoglobin 13.2  12.2 - 16.2 g/dL   HCT, POC 40.9  81.1 - 47.9 %   MCV 99.1 (*) 80 - 97 fL   MCH, POC 31.4 (*) 27 - 31.2 pg   MCHC 31.7 (*) 31.8 - 35.4 g/dL   RDW, POC 91.4     Platelet Count, POC 274  142 - 424 K/uL   MPV 8.9  0 - 99.8 fL        Assessment & Plan:  Toe swelling - Plan: POCT CBC, sulfamethoxazole-trimethoprim (BACTRIM DS,SEPTRA DS) 800-160 MG per tablet  Erythema of toe - Plan: POCT CBC, sulfamethoxazole-trimethoprim (BACTRIM DS,SEPTRA DS) 800-160 MG per tablet   Ms. Thomasene Lot is a very pleasant 52 yr old female with 1 day of tenderness and erythema of the right 5th toe.  NKI.  Etiology is somewhat unclear.  CBC is normal which is reassuring, and the area is not warm.  Symptoms could be related to trauma, though pt cannot recall this.  ?gout though this is an atypical location and she has no history of this.  Discussed options with pt - I think it is reasonable to monitor symptoms closely as this just began today.  I have sent bactrim to the pharmacy to be started if worsening signs of infection.  In the meantime 600-800mg  ibuprofen q8h with food for pain relief, which should also improve gout should that be the case.  Pt to call or RTC if worsening, not improving or new symptoms.

## 2013-01-01 NOTE — Patient Instructions (Addendum)
Watch symptoms closely.  If you are worsening or developing signs of infection - warmth, increased pain, increased swelling, increased redness, streaks - start the antibiotic.  For now, 600-800mg  ibuprofen every 8 hours with food for pain relief.    If anything is changing, worsening, or you have concerns please let us know

## 2013-01-13 ENCOUNTER — Encounter: Payer: Self-pay | Admitting: Sports Medicine

## 2013-01-13 ENCOUNTER — Ambulatory Visit (INDEPENDENT_AMBULATORY_CARE_PROVIDER_SITE_OTHER): Payer: 59 | Admitting: Sports Medicine

## 2013-01-13 VITALS — BP 128/80 | Ht 66.5 in | Wt 180.0 lb

## 2013-01-13 DIAGNOSIS — M7660 Achilles tendinitis, unspecified leg: Secondary | ICD-10-CM

## 2013-01-13 DIAGNOSIS — M7662 Achilles tendinitis, left leg: Secondary | ICD-10-CM

## 2013-01-13 NOTE — Progress Notes (Signed)
  Subjective:    Patient ID: Shannon Watts, female    DOB: 04/20/1960, 52 y.o.   MRN: [id number]  HPI 52 yo woman presents for 6 week f/u for achilles tendinopathy  Pt reports being compliant with eccentric loading exercises Pt reports significant improvement in heel pain. No popping during activities Pt recently started a boot camp exercise program involving explosive movements and her achilles has tolerated it well   Review of Systems No swelling, redness, or pain during rest    Objective:   Physical Exam General: pleasant, healthy looking woman, in no acute distress Left Ankle: Appearance: mildly thickened achilles Palpation: no obvious mass at the musculotendinous junction, non tender to palpation along the achilles Active ROM: plantar/dorsi flexion wnl, inversion/eversion wnl, non painful PROM: plantar/dorsi flexion wnl, inversion/eversion wnl, non painful Special tests: thompson test wnl  MSK [place] Limited Achilles: mild peritendinous effusion. Thickened tendon at the musculotendinous junction measuring 0.57 cm, decreased from 0.65 cm from six weeks ago. No gross evidence of tearing.         Assessment & Plan:  Achilles tendonopathy: improving. - cont eccentric loading exercises - cont heel lift inserts - may continue boot camp - f/u in 6 weeks - will hold of on NTG therapy at this time  [place], PGY-3

## 2013-02-26 ENCOUNTER — Ambulatory Visit: Payer: 59 | Admitting: Sports Medicine

## 2013-03-09 ENCOUNTER — Ambulatory Visit (INDEPENDENT_AMBULATORY_CARE_PROVIDER_SITE_OTHER): Payer: 59 | Admitting: Sports Medicine

## 2013-03-09 ENCOUNTER — Encounter: Payer: Self-pay | Admitting: Sports Medicine

## 2013-03-09 VITALS — BP 143/89 | HR 76 | Ht 66.0 in | Wt 180.0 lb

## 2013-03-09 DIAGNOSIS — M25579 Pain in unspecified ankle and joints of unspecified foot: Secondary | ICD-10-CM

## 2013-03-09 NOTE — Progress Notes (Signed)
  Subjective:    Patient ID: Shannon Watts, female    DOB: Dec 18, 1960, 52 y.o.   MRN: [id number]  HPI Patient comes in today for followup on left Achilles tendinopathy. Doing well. Feels like she is about 70% improved. Complaint today is pain along the medial aspect of the left ankle. She describes it as a "pinching sensation". She ran 3 miles a couple of days ago and began to notice discomfort thereafter. No swelling.    Review of Systems     Objective:   Physical Exam No acute distress  Left ankle: No tenderness to palpation along the Achilles tendon. No appreciable thickening or nodularity. No pain with Achilles stretches. There is some tenderness to palpation along the medial distal tibia but no soft tissue swelling. Neurovascularly intact distally. Walking without significant limp.  MSK ultrasound of the left ankle: Achilles tendon appears to be slightly thickened. Thickness was not measured but images were compared to previous pictures. Minimal fluid in the tendon sheath. There is some moderate swelling along the course of the posterior tibialis tendon just behind the medial malleolus where there appears to also be a small spur.       Assessment & Plan:  Improved left Achilles tendinopathy Mild posterior tibialis tendon strain, left ankle  Patient will continue with eccentric exercises for her Achilles tendon. Continue with heel lifts as well. I've given her a body helix compression sleeve for the left ankle. I think her posterior tibialis tendon strain is a result of running 3 miles a few days ago which was a fairly large increase for her. I've asked that she wait a week or two to let her pain resolved before starting a run/walk program. She will start slow and gradually increase her activity. She is doing very well from the standpoint of her Achilles tendon and I think at this point she can followup when necessary.

## 2013-05-18 ENCOUNTER — Other Ambulatory Visit: Payer: Self-pay

## 2013-05-18 DIAGNOSIS — Z1231 Encounter for screening mammogram for malignant neoplasm of breast: Secondary | ICD-10-CM

## 2013-06-15 ENCOUNTER — Ambulatory Visit
Admission: RE | Admit: 2013-06-15 | Discharge: 2013-06-15 | Disposition: A | Discharge status: Home / Self Care | Payer: 59 | Source: Ambulatory Visit

## 2013-06-15 DIAGNOSIS — Z1231 Encounter for screening mammogram for malignant neoplasm of breast: Secondary | ICD-10-CM

## 2013-07-15 ENCOUNTER — Ambulatory Visit (INDEPENDENT_AMBULATORY_CARE_PROVIDER_SITE_OTHER): Payer: 59 | Admitting: Sports Medicine

## 2013-07-15 ENCOUNTER — Ambulatory Visit
Admission: RE | Admit: 2013-07-15 | Discharge: 2013-07-15 | Disposition: A | Discharge status: Home / Self Care | Payer: 59 | Source: Ambulatory Visit | Attending: Sports Medicine | Admitting: Sports Medicine

## 2013-07-15 ENCOUNTER — Encounter: Payer: Self-pay | Admitting: Sports Medicine

## 2013-07-15 VITALS — BP 135/87 | HR 80 | Ht 66.0 in | Wt 140.0 lb

## 2013-07-15 DIAGNOSIS — M25579 Pain in unspecified ankle and joints of unspecified foot: Secondary | ICD-10-CM

## 2013-07-15 NOTE — Progress Notes (Signed)
   Subjective:    Patient ID: Shannon GamblesCynthia M Van Watts, female    DOB: 1960/11/16, 53 y.o.   MRN: [id number]  HPI chief complaint: Left ankle pain  Patient comes in today complaining of 2-3 weeks of medial sided left ankle pain. She is currently in the process of training for a half marathon in May. She describes a "catching", sharp discomfort along the medial malleolus when running. It does not keep her from running however. There is no swelling. She has had similar issues with his ankle in the past. No recent trauma. No lateral ankle pain. Since her last office visit she was diagnosed with fatty liver and as a result drastically changed her diet. She has since lost about 50 pounds.    Review of Systems     Objective:   Physical Exam Well-developed, fit-appearing. No acute distress. Awake alert and oriented x3. Vital signs reviewed.  Left ankle: Full range of motion. No effusion. No soft tissue swelling. There is point tenderness to palpation in the tibial metaphysis just prior to the medial malleolus. Positive hop test. No tenderness to palpation along the posterior tibialis tendon. No tenderness to palpation over the lateral malleolus. Neurovascularly intact distally. Walking without a limp.  MSK ultrasound: Limited images of the distal tibia and medial malleolus were obtained. There is no cortical irregularity, neovascularity, or hypoechoic change to suggest stress fracture.  X-rays of the left ankle including AP and lateral views also show no definitive medial malleolar stress fracture.       Assessment & Plan:  Left ankle pain possibly secondary to medial malleolar stress reaction  Patient would like to try an x type compression sleeve for each ankle. She will also resume her ankle strengthening exercises. We discussed the possibility of further diagnostic imaging in the form of an MRI of her symptoms persist or worsen. Followup with me in 3-4 weeks with her running shoes and orthotics  or sooner is symptoms worsen.

## 2013-07-31 ENCOUNTER — Other Ambulatory Visit: Payer: Self-pay | Admitting: Obstetrics and Gynecology

## 2013-08-12 ENCOUNTER — Ambulatory Visit: Payer: 59 | Admitting: Sports Medicine

## 2013-08-19 ENCOUNTER — Ambulatory Visit: Payer: 59 | Admitting: Sports Medicine

## 2013-08-26 ENCOUNTER — Encounter: Payer: Self-pay | Admitting: Sports Medicine

## 2013-08-26 ENCOUNTER — Ambulatory Visit (INDEPENDENT_AMBULATORY_CARE_PROVIDER_SITE_OTHER): Payer: 59 | Admitting: Sports Medicine

## 2013-08-26 VITALS — BP 132/78 | Ht 66.0 in | Wt 140.0 lb

## 2013-08-26 DIAGNOSIS — M25579 Pain in unspecified ankle and joints of unspecified foot: Secondary | ICD-10-CM

## 2013-08-26 NOTE — Progress Notes (Signed)
   Subjective:    Patient ID: Shannon Watts, female    DOB: 1961-01-06, 53 y.o.   MRN: [id number]  HPI Patient comes in today for followup on left ankle pain. Pain has improved. In fact, she is getting ready to run a half marathon this weekend. She does not like her custom orthotics. Instead, she has purchased some super feet carbon fiber inserts which she finds to be much more comfortable. She feels like a custom orthotics are 2 bulky and just causes general overall foot soreness. She is comfortable in a minimalist shoe.    Review of Systems     Objective:   Physical Exam Well-developed, well-nourished. Fit-appearing. No acute distress  Left ankle: Full range of motion. No soft tissue swelling. No tenderness to palpation or percussion along the medial malleolus. Neurovascular intact distally walking without a limp.       Assessment & Plan:  Improved left ankle pain likely secondary to small medial malleolus stress reaction  Since the patient is uncomfortable in her custom orthotics she will discontinue those in favor of her super feet carbon fiber inserts. I see no reason that she cannot continue to increase activity as tolerated and followup with me when necessary.

## 2013-09-30 ENCOUNTER — Other Ambulatory Visit: Payer: Self-pay | Admitting: Gastroenterology

## 2013-09-30 DIAGNOSIS — R945 Abnormal results of liver function studies: Principal | ICD-10-CM

## 2013-09-30 DIAGNOSIS — R7989 Other specified abnormal findings of blood chemistry: Secondary | ICD-10-CM

## 2013-09-30 DIAGNOSIS — R1033 Periumbilical pain: Secondary | ICD-10-CM

## 2013-10-07 ENCOUNTER — Other Ambulatory Visit: Payer: 59

## 2013-10-14 ENCOUNTER — Ambulatory Visit
Admission: RE | Admit: 2013-10-14 | Discharge: 2013-10-14 | Disposition: A | Discharge status: Home / Self Care | Payer: 59 | Source: Ambulatory Visit | Attending: Gastroenterology | Admitting: Gastroenterology

## 2013-10-14 DIAGNOSIS — R1033 Periumbilical pain: Secondary | ICD-10-CM

## 2013-10-14 DIAGNOSIS — R7989 Other specified abnormal findings of blood chemistry: Secondary | ICD-10-CM

## 2013-10-14 DIAGNOSIS — R945 Abnormal results of liver function studies: Principal | ICD-10-CM

## 2013-10-14 MED ORDER — GADOBENATE DIMEGLUMINE 529 MG/ML IV SOLN
12.0000 mL | Freq: Once | INTRAVENOUS | Status: AC | PRN
  Administered 2013-10-14: 13 mL via INTRAVENOUS

## 2014-05-17 ENCOUNTER — Other Ambulatory Visit: Payer: Self-pay

## 2014-05-17 DIAGNOSIS — Z1231 Encounter for screening mammogram for malignant neoplasm of breast: Secondary | ICD-10-CM

## 2014-06-17 ENCOUNTER — Ambulatory Visit
Admission: RE | Admit: 2014-06-17 | Discharge: 2014-06-17 | Disposition: A | Discharge status: Home / Self Care | Payer: Self-pay | Source: Ambulatory Visit

## 2014-06-17 DIAGNOSIS — Z1231 Encounter for screening mammogram for malignant neoplasm of breast: Secondary | ICD-10-CM

## 2014-10-19 ENCOUNTER — Other Ambulatory Visit: Payer: Self-pay | Admitting: Internal Medicine

## 2014-10-19 ENCOUNTER — Ambulatory Visit
Admission: RE | Admit: 2014-10-19 | Discharge: 2014-10-19 | Disposition: A | Discharge status: Home / Self Care | Payer: 59 | Source: Ambulatory Visit | Attending: Internal Medicine | Admitting: Internal Medicine

## 2014-10-19 DIAGNOSIS — R319 Hematuria, unspecified: Secondary | ICD-10-CM

## 2014-12-15 ENCOUNTER — Ambulatory Visit (INDEPENDENT_AMBULATORY_CARE_PROVIDER_SITE_OTHER): Payer: 59 | Admitting: Sports Medicine

## 2014-12-15 ENCOUNTER — Encounter: Payer: Self-pay | Admitting: Sports Medicine

## 2014-12-15 VITALS — BP 123/72 | Ht 66.0 in | Wt 155.0 lb

## 2014-12-15 DIAGNOSIS — M25512 Pain in left shoulder: Secondary | ICD-10-CM | POA: Diagnosis not present

## 2014-12-15 MED ORDER — MELOXICAM 15 MG PO TABS: ORAL_TABLET | ORAL | Status: DC

## 2014-12-15 NOTE — Progress Notes (Signed)
    Subjective:  Shannon Watts is a 54 y.o. female who presents to the [facility] today with a chief complaint of left shoulder pain.   HPI: Patient presents with approximately 2 weeks of left shoulder pain. Patient reports having pain in her shoulder for several years in the past, but has been relatively well controlled until this flare. Denies any injuries, though does state that she was running a "boot camp" and had strenuous activity a couple of weeks ago. Pain is mostly located in the anterior aspect of her shoulder. Pain is worse with movement, specifically pushups. Described as an achy, cramplike pain with some radiation into her arm. Has tried taking  ibuprofen which helps some. Pain is consistent with prior rotator cuff injuries. Patient has received injections for this in the past, which helped.   Review of Systems   Objective:  Physical Exam: BP 123/72 mmHg  Ht  (1.676 m)  Wt 155 lb (70.308 kg)  BMI 25.03 kg/m2  Gen: NAD, resting comfortably Lungs: NWOB MSK: - Left shoulder: Tender to palpation along medial aspect of arm. Biceps tendon nontender. Good ROM. Some pain with external rotation. Positive empty can. Positive Hawkins. Negative O'Brien. Some pain elicited with axial loading and circumduction. Strength 5/5 in all directions.  Skin: warm, dry Neuro: grossly normal, moves all extremities Psych: Normal affect and thought content   Assessment/Plan:  Left Shoulder Pain Patient reports pain similar to prior rotator cuff tendonitis flares. Given location and some pain with axial loading, can also consider labral tear. Will treat conservatively at this point. Will given meloxicam  daily for 7 days then as needed. Reviewed rotator cuff strengthening exercises. Will follow up as needed. If continue to have pain, can consider injection.   Katina Degree. Jimmey Ralph, MD [facility] Resident PGY-2 12/15/2014 11:29 AM   Patient seen and evaluated with the  above-named resident. I agree with the plan of care. Her exam and history suggest both possible rotator cuff irritation as well as labral pathology. We will try a one-week period of meloxicam and I've instructed her to refrain from any exercise that loads the shoulder joint. If symptoms persist for another couple of weeks and the patient will return to the office for a diagnostic/therapeutic subacromial cortisone injection. She has had these in the past with good results and she does state that her current pain is similar in nature to what she experienced in the past.

## 2014-12-29 ENCOUNTER — Ambulatory Visit: Payer: 59 | Admitting: Sports Medicine

## 2015-01-10 ENCOUNTER — Encounter: Payer: Self-pay | Admitting: Sports Medicine

## 2015-01-10 ENCOUNTER — Ambulatory Visit (INDEPENDENT_AMBULATORY_CARE_PROVIDER_SITE_OTHER): Payer: 59 | Admitting: Sports Medicine

## 2015-01-10 VITALS — BP 123/58 | Ht 66.0 in | Wt 155.0 lb

## 2015-01-10 DIAGNOSIS — M25512 Pain in left shoulder: Secondary | ICD-10-CM | POA: Diagnosis not present

## 2015-01-10 NOTE — Assessment & Plan Note (Signed)
Patient reports some improvement since her last visit. She had been taking meloxicam, but was not performing the home exercises we had provided her (b/c she was doing her "boot camp" exercises). Findings today are most consistent with bicipital long head tendinitis. Her rotator cuff strength seems much improved from previous exam, which is likely helping her symptoms. She does not appear to have significant instability. However, at this time cannot rule out degenerative labral pathology/tearing due to the possibility of symptomatic crossover between bicipital tendinitis and superior labral tears. - Stressed performing rotator cuff strengthening exercises daily (with knowledge that she likely would not perform these daily) - Encouraged avoidance of exercises that cause pain/discomfort (i.e. pushups, dips) - Ice/heat liberally - Additional 5 days of meloxicam - Follow-up in 3 weeks  Next: No subacromial injection performed today, patient states that she is open to this in the future but would like to avoid it as much as possible. Consider injection if pain worsens. Consider ultrasound imaging.

## 2015-01-10 NOTE — Progress Notes (Signed)
HPI  CC: Follow-up left shoulder pain Patient is here for follow-up on her left shoulder pain. He was last seen on 12/15/14. At that time she was provided a prescription for meloxicam to take for 7 days, and a rotator cuff strengthening exercise program. She states that this pain is about 30-40% better, however she has not been performing the rotator cuff strengthening exercises. She continues to participate in a "boot camp" exercise program. She reports some discomfort with the exercises she performs, specifically with pushups. However she has recognized that many of the other exercises use to cause discomfort have now improved or resolved completely. Discomfort is located to the anterior aspect of the shoulder, it does not radiate. She does endorse feeling as though the left shoulder is weaker than the right.  ROS: Denies injury, swelling, erythema, ecchymoses, paresthesias, weakness, numbness, fever, chills, headache.  Objective: BP 123/58 mmHg  Ht  (1.676 m)  Wt 155 lb (70.308 kg)  BMI 25.03 kg/m2 Gen: NAD, alert, cooperative, and pleasant. Shoulder: - Inspection reveals no abnormalities, atrophy or asymmetry. - Pain experienced with palpation over the bicipital groove. No AC joint tenderness. - ROM is full in all planes. - Rotator cuff strength 5/5 throughout w/ good Lift-off strength - Negative Hawkin's tests; mildly positive empty can. - Speeds test was positive; Yergason's negative. - Positive O'Brien's with greater discomfort with thumb down compared to thumb up.  - Normal scapular function observed. - No painful arc and no drop arm sign. - No apprehension sign Neuro: Alert and oriented, Speech clear, No gross deficits  Assessment and plan:  Left anterior shoulder pain Patient reports some improvement since her last visit. She had been taking meloxicam, but was not performing the home exercises we had provided her (b/c she was doing her "boot camp" exercises). Findings  today are most consistent with bicipital long head tendinitis. Her rotator cuff strength seems much improved from previous exam, which is likely helping her symptoms. She does not appear to have significant instability. However, at this time cannot rule out degenerative labral pathology/tearing due to the possibility of symptomatic crossover between bicipital tendinitis and superior labral tears. - Stressed performing rotator cuff strengthening exercises daily (with knowledge that she likely would not perform these daily) - Encouraged avoidance of exercises that cause pain/discomfort (i.e. pushups, dips) - Ice/heat liberally - Additional 5 days of meloxicam - Follow-up in 3 weeks  Next: No subacromial injection performed today, patient states that she is open to this in the future but would like to avoid it as much as possible. Consider injection if pain worsens. Consider ultrasound imaging.    Meds ordered this encounter  Medications  . cephALEXin (KEFLEX) 500 MG capsule    Sig: TAKE ONE CAPSULE BY MOUTH 4 TIMES A DAY    Refill:  0  . tamsulosin (FLOMAX) 0.4 MG CAPS capsule    Sig: Take 0.4 mg by mouth daily.    Refill:  0  . oxyCODONE-acetaminophen (PERCOCET/ROXICET) 5-325 MG per tablet    Sig: TAKE 1 TABLET EVERY 4 TO 6 HOURS AS NEEDED FOR PAIN    Refill:  0  . ciprofloxacin (CIPRO) 250 MG tablet    Sig: Take 250 mg by mouth 2 (two) times daily.    Refill:  0     Kathee Delton, MD,MS,  PGY2 01/10/2015 11:02 AM   Patient seen and evaluated with the above-named resident. I agree with plan of care. Patient will return to the office  in 3 weeks for reevaluation. I've encouraged her to be compliant with her home exercises and avoid those activities in the gym that she knows will cause her pain.

## 2015-01-20 ENCOUNTER — Other Ambulatory Visit: Payer: Self-pay | Admitting: Dermatology

## 2015-01-31 ENCOUNTER — Ambulatory Visit: Payer: 59 | Admitting: Sports Medicine

## 2015-02-15 ENCOUNTER — Ambulatory Visit: Payer: 59 | Admitting: Sports Medicine

## 2015-02-18 ENCOUNTER — Other Ambulatory Visit: Payer: Self-pay | Admitting: Urology

## 2015-03-07 ENCOUNTER — Encounter (HOSPITAL_BASED_OUTPATIENT_CLINIC_OR_DEPARTMENT_OTHER): Payer: Self-pay | Admitting: *Deleted

## 2015-03-08 ENCOUNTER — Encounter (HOSPITAL_BASED_OUTPATIENT_CLINIC_OR_DEPARTMENT_OTHER): Payer: Self-pay | Admitting: *Deleted

## 2015-03-08 NOTE — Progress Notes (Signed)
NPO AFTER MN.  ARRIVE 1000.  NEEDS HG.  WILL TAKE NEXIUM AM DOS W/ SIPS OF WATER.

## 2015-03-14 ENCOUNTER — Encounter (HOSPITAL_BASED_OUTPATIENT_CLINIC_OR_DEPARTMENT_OTHER)
Admission: RE | Disposition: A | Discharge status: Home / Self Care | Payer: Self-pay | Source: Ambulatory Visit | Attending: Urology

## 2015-03-14 ENCOUNTER — Ambulatory Visit (HOSPITAL_BASED_OUTPATIENT_CLINIC_OR_DEPARTMENT_OTHER): Payer: 59 | Admitting: Anesthesiology

## 2015-03-14 ENCOUNTER — Ambulatory Visit (HOSPITAL_BASED_OUTPATIENT_CLINIC_OR_DEPARTMENT_OTHER)
Admission: RE | Admit: 2015-03-14 | Discharge: 2015-03-14 | Disposition: A | Discharge status: Home / Self Care | Payer: 59 | Source: Ambulatory Visit | Attending: Urology | Admitting: Urology

## 2015-03-14 ENCOUNTER — Encounter (HOSPITAL_BASED_OUTPATIENT_CLINIC_OR_DEPARTMENT_OTHER): Payer: Self-pay | Admitting: *Deleted

## 2015-03-14 DIAGNOSIS — Z87442 Personal history of urinary calculi: Secondary | ICD-10-CM | POA: Insufficient documentation

## 2015-03-14 DIAGNOSIS — Z79899 Other long term (current) drug therapy: Secondary | ICD-10-CM | POA: Diagnosis not present

## 2015-03-14 DIAGNOSIS — N201 Calculus of ureter: Secondary | ICD-10-CM | POA: Insufficient documentation

## 2015-03-14 DIAGNOSIS — K219 Gastro-esophageal reflux disease without esophagitis: Secondary | ICD-10-CM | POA: Diagnosis not present

## 2015-03-14 DIAGNOSIS — Z87891 Personal history of nicotine dependence: Secondary | ICD-10-CM | POA: Insufficient documentation

## 2015-03-14 HISTORY — PX: CYSTOSCOPY WITH RETROGRADE PYELOGRAM, URETEROSCOPY AND STENT PLACEMENT: SHX5789

## 2015-03-14 HISTORY — DX: Personal history of other diseases of the digestive system: Z87.19

## 2015-03-14 HISTORY — DX: Gastro-esophageal reflux disease without esophagitis: K21.9

## 2015-03-14 HISTORY — DX: Calculus of ureter: N20.1

## 2015-03-14 HISTORY — DX: Presence of spectacles and contact lenses: Z97.3

## 2015-03-14 HISTORY — DX: Personal history of urinary calculi: Z87.442

## 2015-03-14 HISTORY — DX: Nausea with vomiting, unspecified: R11.2

## 2015-03-14 HISTORY — DX: Other specified postprocedural states: Z98.890

## 2015-03-14 HISTORY — PX: HOLMIUM LASER APPLICATION: SHX5852

## 2015-03-14 LAB — POCT HEMOGLOBIN-HEMACUE: Hemoglobin: 14.8 g/dL (ref 12.0–15.0)

## 2015-03-14 SURGERY — CYSTOURETEROSCOPY, WITH RETROGRADE PYELOGRAM AND STENT INSERTION
Anesthesia: General | Site: Renal | Laterality: Right

## 2015-03-14 MED ORDER — KETOROLAC TROMETHAMINE 30 MG/ML IJ SOLN
INTRAMUSCULAR | Status: DC | PRN
  Administered 2015-03-14: 30 mg via INTRAVENOUS

## 2015-03-14 MED ORDER — SODIUM CHLORIDE 0.9 % IR SOLN
Status: DC | PRN
  Administered 2015-03-14: 4000 mL

## 2015-03-14 MED ORDER — IOHEXOL 350 MG/ML SOLN
INTRAVENOUS | Status: DC | PRN
  Administered 2015-03-14: 2 mL

## 2015-03-14 MED ORDER — FENTANYL CITRATE (PF) 100 MCG/2ML IJ SOLN
25.0000 ug | INTRAMUSCULAR | Status: DC | PRN
  Filled 2015-03-14: qty 1

## 2015-03-14 MED ORDER — DEXAMETHASONE SODIUM PHOSPHATE 10 MG/ML IJ SOLN
INTRAMUSCULAR | Status: AC
  Filled 2015-03-14: qty 1

## 2015-03-14 MED ORDER — LIDOCAINE HCL (CARDIAC) 20 MG/ML IV SOLN
INTRAVENOUS | Status: DC | PRN
  Administered 2015-03-14: 60 mg via INTRAVENOUS

## 2015-03-14 MED ORDER — PHENYLEPHRINE 40 MCG/ML (10ML) SYRINGE FOR IV PUSH (FOR BLOOD PRESSURE SUPPORT)
PREFILLED_SYRINGE | INTRAVENOUS | Status: AC
  Filled 2015-03-14: qty 20

## 2015-03-14 MED ORDER — FENTANYL CITRATE (PF) 100 MCG/2ML IJ SOLN
INTRAMUSCULAR | Status: DC | PRN
  Administered 2015-03-14: 50 ug via INTRAVENOUS

## 2015-03-14 MED ORDER — OXYCODONE-ACETAMINOPHEN 5-325 MG PO TABS: 1.0000 | ORAL_TABLET | ORAL | Status: DC | PRN

## 2015-03-14 MED ORDER — SULFAMETHOXAZOLE-TRIMETHOPRIM 800-160 MG PO TABS: 1.0000 | ORAL_TABLET | Freq: Two times a day (BID) | ORAL | Status: DC

## 2015-03-14 MED ORDER — MIDAZOLAM HCL 2 MG/2ML IJ SOLN
INTRAMUSCULAR | Status: AC
  Filled 2015-03-14: qty 2

## 2015-03-14 MED ORDER — OXYCODONE-ACETAMINOPHEN 5-325 MG PO TABS
1.0000 | ORAL_TABLET | ORAL | Status: AC | PRN
Start: 2015-03-14 — End: 2015-03-14
  Administered 2015-03-14: 1 via ORAL
  Filled 2015-03-14: qty 2

## 2015-03-14 MED ORDER — LACTATED RINGERS IV SOLN
INTRAVENOUS | Status: DC
  Administered 2015-03-14: 11:00:00 via INTRAVENOUS
  Filled 2015-03-14: qty 1000

## 2015-03-14 MED ORDER — LACTATED RINGERS IV SOLN
INTRAVENOUS | Status: DC
  Filled 2015-03-14: qty 1000

## 2015-03-14 MED ORDER — FENTANYL CITRATE (PF) 100 MCG/2ML IJ SOLN
INTRAMUSCULAR | Status: AC
  Filled 2015-03-14: qty 4

## 2015-03-14 MED ORDER — LIDOCAINE HCL (CARDIAC) 20 MG/ML IV SOLN
INTRAVENOUS | Status: AC
  Filled 2015-03-14: qty 5

## 2015-03-14 MED ORDER — ONDANSETRON HCL 4 MG/2ML IJ SOLN
INTRAMUSCULAR | Status: AC
  Filled 2015-03-14: qty 2

## 2015-03-14 MED ORDER — OXYCODONE-ACETAMINOPHEN 5-325 MG PO TABS
ORAL_TABLET | ORAL | Status: AC
  Filled 2015-03-14: qty 1

## 2015-03-14 MED ORDER — CEFAZOLIN SODIUM-DEXTROSE 2-3 GM-% IV SOLR
INTRAVENOUS | Status: AC
  Filled 2015-03-14: qty 50

## 2015-03-14 MED ORDER — DEXAMETHASONE SODIUM PHOSPHATE 4 MG/ML IJ SOLN
INTRAMUSCULAR | Status: DC | PRN
  Administered 2015-03-14: 1 mg via INTRAVENOUS

## 2015-03-14 MED ORDER — GLYCOPYRROLATE 0.2 MG/ML IJ SOLN
INTRAMUSCULAR | Status: AC
  Filled 2015-03-14: qty 1

## 2015-03-14 MED ORDER — PROPOFOL 10 MG/ML IV BOLUS
INTRAVENOUS | Status: DC | PRN
  Administered 2015-03-14: 150 mg via INTRAVENOUS

## 2015-03-14 MED ORDER — MIDAZOLAM HCL 5 MG/5ML IJ SOLN
INTRAMUSCULAR | Status: DC | PRN
  Administered 2015-03-14: 2 mg via INTRAVENOUS

## 2015-03-14 MED ORDER — CEFAZOLIN SODIUM-DEXTROSE 2-3 GM-% IV SOLR
2.0000 g | Freq: Once | INTRAVENOUS | Status: AC
  Administered 2015-03-14: 2 g via INTRAVENOUS
  Filled 2015-03-14: qty 50

## 2015-03-14 MED ORDER — CEFAZOLIN SODIUM 1-5 GM-% IV SOLN
1.0000 g | Freq: Once | INTRAVENOUS | Status: DC
  Filled 2015-03-14: qty 50

## 2015-03-14 MED ORDER — PROPOFOL 10 MG/ML IV BOLUS
INTRAVENOUS | Status: AC
  Filled 2015-03-14: qty 40

## 2015-03-14 MED ORDER — ONDANSETRON HCL 4 MG/2ML IJ SOLN
INTRAMUSCULAR | Status: DC | PRN
  Administered 2015-03-14: 4 mg via INTRAVENOUS

## 2015-03-14 MED ORDER — KETOROLAC TROMETHAMINE 30 MG/ML IJ SOLN
INTRAMUSCULAR | Status: AC
  Filled 2015-03-14: qty 2

## 2015-03-14 SURGICAL SUPPLY — 32 items
BAG URO CATCHER STRL LF (DRAPE) ×3 IMPLANT
BASKET LASER NITINOL 1.9FR (BASKET) IMPLANT
BASKET STONE 1.7 NGAGE (UROLOGICAL SUPPLIES) ×2 IMPLANT
BASKET ZERO TIP NITINOL 2.4FR (BASKET) IMPLANT
BSKT STON RTRVL 120 1.9FR (BASKET)
BSKT STON RTRVL ZERO TP 2.4FR (BASKET)
CANISTER SUCT LVC 12 LTR MEDI- (MISCELLANEOUS) IMPLANT
CATH INTERMIT  6FR 70CM (CATHETERS) IMPLANT
CLOTH BEACON ORANGE TIMEOUT ST (SAFETY) ×3 IMPLANT
FIBER LASER FLEXIVA 365 (UROLOGICAL SUPPLIES) IMPLANT
FIBER LASER TRAC TIP (UROLOGICAL SUPPLIES) ×2 IMPLANT
GLOVE BIO SURGEON STRL SZ7 (GLOVE) ×4 IMPLANT
GLOVE BIO SURGEON STRL SZ8 (GLOVE) ×3 IMPLANT
GLOVE BIOGEL PI IND STRL 6.5 (GLOVE) IMPLANT
GLOVE BIOGEL PI IND STRL 7.0 (GLOVE) IMPLANT
GLOVE BIOGEL PI INDICATOR 6.5 (GLOVE) ×2
GLOVE BIOGEL PI INDICATOR 7.0 (GLOVE) ×4
GOWN STRL REUS W/ TWL LRG LVL3 (GOWN DISPOSABLE) ×1 IMPLANT
GOWN STRL REUS W/ TWL XL LVL3 (GOWN DISPOSABLE) ×1 IMPLANT
GOWN STRL REUS W/TWL LRG LVL3 (GOWN DISPOSABLE) ×6
GOWN STRL REUS W/TWL XL LVL3 (GOWN DISPOSABLE) ×6
GUIDEWIRE ANG ZIPWIRE 038X150 (WIRE) ×3 IMPLANT
GUIDEWIRE STR DUAL SENSOR (WIRE) ×2 IMPLANT
IV NS IRRIG 3000ML ARTHROMATIC (IV SOLUTION) ×3 IMPLANT
KIT ROOM TURNOVER WOR (KITS) ×3 IMPLANT
MANIFOLD NEPTUNE II (INSTRUMENTS) ×2 IMPLANT
PACK CYSTO (CUSTOM PROCEDURE TRAY) ×3 IMPLANT
STENT URET 6FRX26 CONTOUR (STENTS) ×2 IMPLANT
SYRINGE 10CC LL (SYRINGE) ×3 IMPLANT
TUBE CONNECTING 12'X1/4 (SUCTIONS)
TUBE CONNECTING 12X1/4 (SUCTIONS) IMPLANT
TUBE FEEDING 8FR 16IN STR KANG (MISCELLANEOUS) IMPLANT

## 2015-03-14 NOTE — Transfer of Care (Signed)
Immediate Anesthesia Transfer of Care Note  Patient: Shannon Watts  Procedure(s) Performed: Procedure(s): CYSTOSCOPY WITH RETROGRADE PYELOGRAM, URETEROSCOPY, STONE EXTRACTION  AND STENT PLACEMENT (Right) HOLMIUM LASER APPLICATION (Right)  Patient Location: PACU  Anesthesia Type:General  Level of Consciousness: awake, alert , oriented and patient cooperative  Airway & Oxygen Therapy: Patient Spontanous Breathing and Patient connected to nasal cannula oxygen  Post-op Assessment: Report given to RN and Post -op Vital signs reviewed and stable  Post vital signs: Reviewed and stable  Last Vitals:  Filed Vitals:   03/14/15 1018  BP: 135/76  Pulse: 70  Temp: 36.9 C  Resp: 12    Complications: No apparent anesthesia complications

## 2015-03-14 NOTE — Anesthesia Postprocedure Evaluation (Signed)
Anesthesia Post Note  Patient: Shannon Watts  Procedure(s) Performed: Procedure(s) (LRB): CYSTOSCOPY WITH RETROGRADE PYELOGRAM, URETEROSCOPY, STONE EXTRACTION  AND STENT PLACEMENT (Right) HOLMIUM LASER APPLICATION (Right)  Patient location during evaluation: PACU Anesthesia Type: General Level of consciousness: awake and alert Pain management: pain level controlled Vital Signs Assessment: post-procedure vital signs reviewed and stable Respiratory status: spontaneous breathing, nonlabored ventilation, respiratory function stable and patient connected to nasal cannula oxygen Cardiovascular status: blood pressure returned to baseline and stable Postop Assessment: no signs of nausea or vomiting Anesthetic complications: no    Last Vitals:  Filed Vitals:   03/14/15 1400 03/14/15 1449  BP: 118/74 125/74  Pulse: 69 73  Temp:  36.6 C  Resp: 17 14    Last Pain:  Filed Vitals:   03/14/15 1450  PainSc: 3                  Chade Pitner L

## 2015-03-14 NOTE — H&P (Signed)
Urology Admission H&P  Chief Complaint: right flank pain  History of Present Illness: Shannon Watts is a 54yo with a hx of nephrolithiasis who has failed MET for a right ureteral stone. She continues to have intermittent sharp, moderate, nonradiating right flank pain. She has had multiple episodes of gross hematuria and urgency. No other LUTS. No fevers/chills/sweats  Past Medical History  Diagnosis Date  . Right ureteral stone   . GERD (gastroesophageal reflux disease)   . History of anal fissures   . PONV (postoperative nausea and vomiting)   . History of kidney stones   . Wears contact lenses    Past Surgical History  Procedure Laterality Date  . Eua/  excision anal polyp  04-02-2008  . Excision  biopsy mass right breast (benign) and repair anal fissure  06-12-2002    Home Medications:  Prescriptions prior to admission  Medication Sig Dispense Refill Last Dose  . ibuprofen (ADVIL,MOTRIN) 200 MG tablet Take 800 mg by mouth every 6 (six) hours as needed.   Past Week at Unknown time  . NEXIUM 40 MG capsule Take 40 mg by mouth every morning.    03/13/2015 at Unknown time  . tamsulosin (FLOMAX) 0.4 MG CAPS capsule Take 0.4 mg by mouth daily after supper.   03/13/2015 at Unknown time  . oxyCODONE-acetaminophen (PERCOCET/ROXICET) 5-325 MG tablet Take by mouth every 4 (four) hours as needed for severe pain.   Unknown at Unknown time   Allergies:  Allergies  Allergen Reactions  . Amoxicillin Hives  . Penicillins Itching    severe    History reviewed. No pertinent family history. Social History:  reports that she quit smoking about 43 years ago. Her smoking use included Cigarettes. She quit after 2 years of use. She does not have any smokeless tobacco history on file. She reports that she does not drink alcohol or use illicit drugs.  Review of Systems  Genitourinary: Positive for urgency, hematuria and flank pain.  All other systems reviewed and are negative.   Physical Exam:   Vital signs in last 24 hours: Temp:  [98.5 F (36.9 C)] 98.5 F (36.9 C) (11/28 1018) Pulse Rate:  [70] 70 (11/28 1018) Resp:  [12] 12 (11/28 1018) BP: (135)/(76) 135/76 mmHg (11/28 1018) SpO2:  [98 %] 98 % (11/28 1018) Weight:  [77.565 kg (171 lb)] 77.565 kg (171 lb) (11/28 1018) Physical Exam  Constitutional: She is oriented to person, place, and time. She appears well-developed and well-nourished.  HENT:  Head: Normocephalic and atraumatic.  Eyes: EOM are normal. Pupils are equal, round, and reactive to light.  Neck: Normal range of motion. No thyromegaly present.  Cardiovascular: Normal rate and regular rhythm.   Respiratory: Effort normal and breath sounds normal.  GI: Soft. She exhibits no distension. There is no tenderness.  Musculoskeletal: Normal range of motion.  Neurological: She is alert and oriented to person, place, and time.  Skin: Skin is warm and dry.  Psychiatric: She has a normal mood and affect. Her behavior is normal. Judgment and thought content normal.    Laboratory Data:  Results for orders placed or performed during the hospital encounter of 03/14/15 (from the past 24 hour(s))  Hemoglobin-hemacue, POC     Status: None   Collection Time: 03/14/15 11:18 AM  Result Value Ref Range   Hemoglobin 14.8 12.0 - 15.0 g/dL   No results found for this or any previous visit (from the past 240 hour(s)). Creatinine: No results for input(s): CREATININE in the  last 168 hours. Baseline Creatinine: unknown  Impression/Assessment:  54yo with right ureteral calculus,   Plan:  The risks/benefits/alternatives to cysto, right retrograde, right ureteral stone extraction with laser was explained to the patient and she understands and wishes to proceed with surgery  Kanyah Matsushima L 03/14/2015, 12:01 PM

## 2015-03-14 NOTE — Brief Op Note (Signed)
03/14/2015  12:51 PM  PATIENT:  Shannon Watts  54 y.o. female  PRE-OPERATIVE DIAGNOSIS:  RIGHT URETERAL STONE  POST-OPERATIVE DIAGNOSIS:  RIGHT URETERAL STONE  PROCEDURE:  Procedure(s): CYSTOSCOPY WITH RETROGRADE PYELOGRAM, URETEROSCOPY, STONE EXTRACTION  AND STENT PLACEMENT (Right) HOLMIUM LASER APPLICATION (Right)  SURGEON:  Surgeon(s) and Role:    * Malen GauzePatrick L Arlington Sigmund, MD - Primary  PHYSICIAN ASSISTANT:   ASSISTANTS: none   ANESTHESIA:   general  EBL:     BLOOD ADMINISTERED:none  DRAINS: right 6x26 JJ ureteral stent with tether  LOCAL MEDICATIONS USED:  NONE  SPECIMEN:  Source of Specimen:  stone  DISPOSITION OF SPECIMEN:  N/A  COUNTS:  YES  TOURNIQUET:  * No tourniquets in log *  DICTATION: .Note written in EPIC  PLAN OF CARE: Discharge to home after PACU  PATIENT DISPOSITION:  PACU - hemodynamically stable.   Delay start of Pharmacological VTE agent (>24hrs) due to surgical blood loss or risk of bleeding: not applicable

## 2015-03-14 NOTE — Op Note (Signed)
Preoperative diagnosis: Right ureteral stone  Postoperative diagnosis: Same  Procedure: 1 cystoscopy 2 right retrograde pyelography 3.  Intraoperative fluoroscopy, under one hour, with interpretation 4.  Right ureteroscopic stone manipulation with laser lithotripsy 5.  Right 6 x 26 JJ stent placement  Attending: Wilkie AyePatrick Brinnley Lacap  Anesthesia: General  Estimated blood loss: None  Drains: Right 6 x 26 JJ ureteral stent with tether  Specimens: stone  Antibiotics: ancef  Findings: Right proximal mid ureteral calculus with proximal moderate hydronephrosis. No masses/lesions in the bladder  Indications: Patient is a 54 year old female with a history of ureteral stone and who has failed medical expulsive therapy.  After discussing treatment options, she decided proceed with right ureteroscopic stone manipulation.  Procedure her in detail: The patient was brought to the operating room and a brief timeout was done to ensure correct patient, correct procedure, correct site.  General anesthesia was administered patient was placed in dorsal lithotomy position.  Her genitalia was then prepped and draped in usual sterile fashion.  A rigid 22 French cystoscope was passed in the urethra and the bladder.  Bladder was inspected free masses or lesions.  the right ureteral orifices were in the normal orthotopic locations.  a 6 french ureteral catheter was then instilled into the right ureter orifice.  a gentle retrograde was obtained and findings noted above.  we then placed a zip wire through the ureteral catheter and advanced up to the renal pelvis.  we then removed the cystoscope and cannulated the right ureteral orifice with a semirigid ureteroscope.  we then encountered the stone in the mid ureter.  using using a 200 nm laser fiber and fragmented the stone into smaller pieces.  the pieces were then removed with a engage basket.  We then placed and good coil was noted in the the renal pelvis under  fluoroscopy and the bladder under direct vision.   once all stone fragments were removed we then placed a 6 x 26 double-j ureteral stent over the original zip wire.  the stone fragments were then removed from the bladder and sent for analysis.   the bladder was then drained and this concluded the procedure which was well tolerated by patient.  Complications: None  Condition: Stable, extubated, transferred to PACU  Plan: Patient is to be discharged home as to follow-up in one week for stone composition. She is to removed her stent in 72 hours by pulling the tether

## 2015-03-14 NOTE — Anesthesia Procedure Notes (Signed)
Procedure Name: LMA Insertion Date/Time: 03/14/2015 12:15 PM Performed by: Tyrone NineSAUVE, Loyal Holzheimer F Pre-anesthesia Checklist: Patient identified, Timeout performed, Emergency Drugs available, Suction available and Patient being monitored Patient Re-evaluated:Patient Re-evaluated prior to inductionOxygen Delivery Method: Circle system utilized Preoxygenation: Pre-oxygenation with 100% oxygen Intubation Type: IV induction Ventilation: Mask ventilation without difficulty LMA: LMA inserted LMA Size: 4.0 Number of attempts: 1 Airway Equipment and Method: Bite block Tube secured with: Tape Dental Injury: Teeth and Oropharynx as per pre-operative assessment

## 2015-03-14 NOTE — Discharge Instructions (Signed)

## 2015-03-14 NOTE — Anesthesia Preprocedure Evaluation (Signed)
Anesthesia Evaluation  Patient identified by MRN, date of birth, ID band Patient awake    Reviewed: Allergy & Precautions, H&P , NPO status , Patient's Chart, lab work & pertinent test results  History of Anesthesia Complications (+) PONV  Airway Mallampati: II  TM Distance: >3 FB Neck ROM: full    Dental no notable dental hx. (+) Dental Advisory Given, Teeth Intact   Pulmonary neg pulmonary ROS, former smoker,    Pulmonary exam normal breath sounds clear to auscultation       Cardiovascular Exercise Tolerance: Good negative cardio ROS Normal cardiovascular exam Rhythm:regular Rate:Normal     Neuro/Psych negative neurological ROS  negative psych ROS   GI/Hepatic negative GI ROS, Neg liver ROS, GERD  Medicated and Controlled,  Endo/Other  negative endocrine ROS  Renal/GU negative Renal ROS  negative genitourinary   Musculoskeletal   Abdominal   Peds  Hematology negative hematology ROS (+)   Anesthesia Other Findings   Reproductive/Obstetrics negative OB ROS                             Anesthesia Physical Anesthesia Plan  ASA: I  Anesthesia Plan: General   Post-op Pain Management:    Induction: Intravenous  Airway Management Planned: LMA  Additional Equipment:   Intra-op Plan:   Post-operative Plan:   Informed Consent: I have reviewed the patients History and Physical, chart, labs and discussed the procedure including the risks, benefits and alternatives for the proposed anesthesia with the patient or authorized representative who has indicated his/her understanding and acceptance.   Dental Advisory Given  Plan Discussed with: CRNA and Surgeon  Anesthesia Plan Comments:         Anesthesia Quick Evaluation

## 2015-03-15 ENCOUNTER — Encounter (HOSPITAL_BASED_OUTPATIENT_CLINIC_OR_DEPARTMENT_OTHER): Payer: Self-pay | Admitting: Urology

## 2015-05-23 ENCOUNTER — Other Ambulatory Visit: Payer: Self-pay

## 2015-05-23 DIAGNOSIS — Z1231 Encounter for screening mammogram for malignant neoplasm of breast: Secondary | ICD-10-CM

## 2015-06-20 ENCOUNTER — Ambulatory Visit
Admission: RE | Admit: 2015-06-20 | Discharge: 2015-06-20 | Disposition: A | Discharge status: Home / Self Care | Payer: 59 | Source: Ambulatory Visit

## 2015-06-20 DIAGNOSIS — Z1231 Encounter for screening mammogram for malignant neoplasm of breast: Secondary | ICD-10-CM

## 2015-09-20 ENCOUNTER — Ambulatory Visit (INDEPENDENT_AMBULATORY_CARE_PROVIDER_SITE_OTHER): Payer: 59 | Admitting: Sports Medicine

## 2015-09-20 ENCOUNTER — Encounter: Payer: Self-pay | Admitting: Sports Medicine

## 2015-09-20 VITALS — BP 123/83 | HR 68 | Ht 66.0 in | Wt 140.0 lb

## 2015-09-20 DIAGNOSIS — M25572 Pain in left ankle and joints of left foot: Secondary | ICD-10-CM

## 2015-09-20 NOTE — Progress Notes (Signed)
   Subjective:    Patient ID: Shannon Watts, female    DOB: [birth year], 55 y.o.   MRN: [id number]  HPI chief complaint: Left ankle pain  Shannon Watts comes in today complaining of 1.5 weeks of medial left ankle pain. Her pain is identical in nature to a distal tibial stress reaction that she had in the same ankle 2 years ago. She admits that she has recently increased her activity quite a bit. Although she is an avid runner and workout enthusiast, she has recently increased her total mileage. 1.5  weeks ago, without any injury, she began to experience a nagging aching discomfort along the medial ankle just proximal to the medial malleolus. Her pain is present primarily after exercise. She did have some pain the other day with simply walking. She denies numbness or tingling. She denies pain along the lateral ankle. She is still using her orthotics. She has been icing which has been helpful.   Interim medical history reviewed Medications reviewed Allergies reviewed     Review of Systems As above    Objective:   Physical Exam  Well-developed, fit appearing. No acute distress. Awake alert and oriented 3. Vital signs reviewed  Left ankle: Full range of motion. No effusion. No obvious soft tissue swelling. She is tender to palpation and percussion along the distal tibial shaft just proximal to the medial malleolus. No tenderness to palpation over the medial malleolus itself. No tenderness along the lateral ankle. Positive hop test. No tenderness to palpation along the posterior tibialis tendon. Standing on her tiptoes does not reproduce pain. No tenderness to palpation at the origin of the plantar fascia. Neurovascularly intact distally. Walking without a limp.  MSK ultrasound of the left ankle was performed. There is no obvious cortical irregularity or hypoechoic change to suggest a distal tibial stress fracture but there is increased vascularity in the cortex of the bone which could suggest an  early stress reaction here.      Assessment & Plan:   Medial ankle pain likely secondary to early distal tibial stress reaction  Patient's symptoms are identical to a medial tibial stress reaction that she had 2 years ago. I would like to treat her with a short Aircast with activity and she will stop running (but she may continue with her boot camp) until follow-up with me in 4 weeks. I think we can treat this successfully with modified rest but she understands that if she is still getting pain as she modifies her activity then she is still doing a little too much. She may continue icing as needed. I will plan on repeating her ultrasound at follow-up just to make sure that she does not have any evidence of a healing stress fracture. She will call me with questions or concerns prior to that follow-up visit.

## 2015-10-11 ENCOUNTER — Other Ambulatory Visit: Payer: Self-pay | Admitting: Internal Medicine

## 2015-10-11 DIAGNOSIS — E2839 Other primary ovarian failure: Secondary | ICD-10-CM

## 2015-10-19 ENCOUNTER — Ambulatory Visit: Payer: 59 | Admitting: Sports Medicine

## 2015-10-20 ENCOUNTER — Ambulatory Visit: Payer: 59 | Admitting: Sports Medicine

## 2015-11-02 ENCOUNTER — Encounter: Payer: Self-pay | Admitting: Sports Medicine

## 2015-11-02 ENCOUNTER — Ambulatory Visit (INDEPENDENT_AMBULATORY_CARE_PROVIDER_SITE_OTHER): Payer: 59 | Admitting: Sports Medicine

## 2015-11-02 VITALS — BP 103/81

## 2015-11-02 DIAGNOSIS — M25572 Pain in left ankle and joints of left foot: Secondary | ICD-10-CM

## 2015-11-02 NOTE — Progress Notes (Signed)
   Subjective:    Patient ID: Shannon Watts, female    DOB: 07/29/1960, 55 y.o.   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 - fibular head, - lat mal, - achilles, - navicular, - base of 5th, - ATFL, - CFL, - deltoid, - tibfib ligament, - calcaneous, - midfoot  ROM: dorsiflex 30 deg, plantarflex 45 deg, inversion full, eversion full  Provocative tests: - ant drawer, - talar tilt, - ext rotation test, - squeeze test Weakness with resisted: - inversion, - eversion, - plantar flex, - dorsiflex Pain with resisted: - inversion, - eversion, - plantar flex, - dorsiflex   Ultrasound of left ankle: Medial malleolus and distal tibia with no signs of stress reaction. Intact no disruption of bone contour     Assessment & Plan:  Left Ankle Pain - Patient presented for follow-up for left ankle pain. Pain has significantly improved - No signs of stress fracture, ankle instability, syndesmosis injury, or other more serious causes ankle pain - Likely had a stress reaction of the distal tibia that is mostly healed at this time - Recommend continued rest for 2 weeks and then slow return to full activity - Plan  to follow-up as needed

## 2015-11-03 ENCOUNTER — Other Ambulatory Visit: Payer: 59

## 2016-05-23 ENCOUNTER — Other Ambulatory Visit: Payer: Self-pay | Admitting: Obstetrics and Gynecology

## 2016-05-23 DIAGNOSIS — Z1231 Encounter for screening mammogram for malignant neoplasm of breast: Secondary | ICD-10-CM

## 2016-06-20 ENCOUNTER — Ambulatory Visit
Admission: RE | Admit: 2016-06-20 | Discharge: 2016-06-20 | Disposition: A | Discharge status: Home / Self Care | Payer: 59 | Source: Ambulatory Visit | Attending: Obstetrics and Gynecology | Admitting: Obstetrics and Gynecology

## 2016-06-20 DIAGNOSIS — Z1231 Encounter for screening mammogram for malignant neoplasm of breast: Secondary | ICD-10-CM

## 2016-09-18 ENCOUNTER — Ambulatory Visit (INDEPENDENT_AMBULATORY_CARE_PROVIDER_SITE_OTHER): Payer: 59 | Admitting: Sports Medicine

## 2016-09-18 ENCOUNTER — Encounter: Payer: Self-pay | Admitting: Sports Medicine

## 2016-09-18 VITALS — BP 140/80 | Ht 66.0 in | Wt 140.0 lb

## 2016-09-18 DIAGNOSIS — M7632 Iliotibial band syndrome, left leg: Secondary | ICD-10-CM | POA: Diagnosis not present

## 2016-09-19 NOTE — Progress Notes (Signed)
   Subjective:    Patient ID: Shannon Watts, female    DOB: July 23, 1960, 56 y.o.   MRN: [id number]  HPI chief complaint: Left knee pain  Shannon Watts comes in today complaining of several days of lateral left knee pain. She recently increased her mileage while running and feels like that is a contributor factor. Her pain has improved but not resolved. She describes a sharp stabbing pain without any associated swelling. Pain will occasionally radiate up the lateral aspect of her leg. No mechanical symptoms. No trauma.    Review of Systems    as above Objective:   Physical Exam  Well-developed, well-nourished. No acute distress  Left knee: Full range of motion. No effusion. No obvious soft tissue swelling. She is tender to palpation along the distal IT band at the lateral femoral condyle. No tenderness along the medial or lateral joint lines. Negative McMurray's but mildly positive Thessaly's. Good joint stability. Neurovascularly intact distally. Walking without a limp.  Brief MSK ultrasound of the left knee shows no effusion. There is a small pocket of fluid at the distal IT band as it crosses the lateral femoral condyle. Findings are consistent with distal IT band inflammation and possible bursitis.      Assessment & Plan:   Left knee pain secondary to distal IT band inflammation/bursitis  Patient will take scheduled ibuprofen for the next 2-3 days. She will purchase an IT band strap for her knee and will wear it when running. She is educated in IT band stretching and hip abductor strengthening exercises. I've encouraged her to use ice post exercise. She may resume activity as tolerated and will follow-up with me for ongoing or recalcitrant issues.

## 2016-11-12 ENCOUNTER — Ambulatory Visit (INDEPENDENT_AMBULATORY_CARE_PROVIDER_SITE_OTHER): Payer: 59 | Admitting: Sports Medicine

## 2016-11-12 VITALS — BP 122/84 | Ht 64.5 in | Wt 155.0 lb

## 2016-11-12 DIAGNOSIS — M25579 Pain in unspecified ankle and joints of unspecified foot: Secondary | ICD-10-CM

## 2016-11-12 MED ORDER — IBUPROFEN 800 MG PO TABS: ORAL_TABLET | ORAL | 0 refills | Status: DC

## 2016-11-12 NOTE — Progress Notes (Signed)
  Shannon GamblesCynthia M Van Watts - 56 y.o. female MRN [id number]  Date of birth: Aug 17, 1960  SUBJECTIVE:  Including CC & ROS.  CC: L ankle Pain  Patient reports L ankle pain times about 8 days. The pain began when she dropped a frozen pork tenderloin on the anterior-medial portion of her left ankle. The caused some immediate swelling for which she used ice and ibuprofen. She was able to bear wt immediately after this.   This improved and she attended boot camp two days later. At that time she noticed that her achilles tendon and posterior ankle felt tight when she was jumping and pushing off of the left foot. She was able to finish this class and also attended boot camp a few days later. During that class she had to stop due to tightness and pain in her left posterior ankle during pushing off.   She had a pedicure during which the technician massaged that part of her ankle thoroughly. She noticed a bruised area on the posterior ankle after this.   At this time she is able to bear wt without pain and is not limping. She still continues to feel tightness when pushing off of her left foot.    HISTORY: Past Medical, Surgical, Social, and Family History Reviewed & Updated per EMR.   Pertinent Historical Findings include: L sided IT band syndrome Previous left ankle pain  PHYSICAL EXAM:  VS: BP:122/84  HR: bpm  TEMP: ( )  RESP:   HT:5' 4.5" (163.8 cm)   WT:155 lb (70.3 kg)  BMI:26.2 PHYSICAL EXAM: Gen: appears well, nad, nontoxic and pleasant Cv: pulses are 2+ and equal, no distal edema Neuro: sensation intact in lower extremities Skin: no susupicious lesions or rashes. MS: A+O x 3, cooperative, normal mood and affect  Gait: + ambulatory, - limp  Left Ankle Inspection: - deformity, + ecchymosis over distal medial soleus +/- swelling: - lateral, - medial, - anterior, - posterior  Palpation: +/-TTP: No tenderness over tibia or medial malleolus. - fibular head, - lat mal,         - navicular, -  base of 5th, - ATFL, - calcaneous, - midfoot. Mild pain to palpation of distal medial soleus.   ROM: dorsiflex 30 deg, plantarflex 45 deg, inversion full, eversion full  Weakness with resisted: - inversion, - eversion, - plantar flex, - dorsiflex Pain with resisted: - inversion, - eversion, - plantar flex, - dorsiflex Thompson's test negative  Ultrasound L ankle: No bony injury to distal medial malleolus where patient reports trauma. Some spurring evident in ankle joint. Achilles tendon intact with small retro- calcaneal fluid collection. Small hematoma visible underlying ecchymosis.   ASSESSMENT & PLAN: See problem based charting & AVS for pt instructions.  L posterior ankle pain secondary to soleus muscle strain.   Patient will take scheduled ibuprofen 800mg  twice daily for next 5 days. Advised to use ice to the area to help with inflammation and pain. Advised to avoid running, jumping for the next week as inflammation decreases. She will then return to these exercises slowly as pain and discomfort allows. She will follow-up as needed for ongoing issues.

## 2016-11-14 ENCOUNTER — Ambulatory Visit: Payer: 59 | Admitting: Family Medicine

## 2017-01-17 ENCOUNTER — Other Ambulatory Visit: Payer: Self-pay | Admitting: *Deleted

## 2017-01-17 MED ORDER — IBUPROFEN 800 MG PO TABS: ORAL_TABLET | ORAL | 0 refills | Status: DC

## 2017-02-04 ENCOUNTER — Other Ambulatory Visit: Payer: Self-pay

## 2017-02-04 MED ORDER — IBUPROFEN 800 MG PO TABS: ORAL_TABLET | ORAL | 0 refills | Status: DC

## 2017-05-13 ENCOUNTER — Other Ambulatory Visit: Payer: Self-pay | Admitting: Obstetrics and Gynecology

## 2017-05-13 DIAGNOSIS — Z1231 Encounter for screening mammogram for malignant neoplasm of breast: Secondary | ICD-10-CM

## 2017-06-21 ENCOUNTER — Ambulatory Visit
Admission: RE | Admit: 2017-06-21 | Discharge: 2017-06-21 | Disposition: A | Discharge status: Home / Self Care | Payer: 59 | Source: Ambulatory Visit | Attending: Obstetrics and Gynecology | Admitting: Obstetrics and Gynecology

## 2017-06-21 DIAGNOSIS — Z1231 Encounter for screening mammogram for malignant neoplasm of breast: Secondary | ICD-10-CM

## 2018-05-12 ENCOUNTER — Other Ambulatory Visit: Payer: Self-pay | Admitting: Obstetrics and Gynecology

## 2018-05-12 DIAGNOSIS — Z1231 Encounter for screening mammogram for malignant neoplasm of breast: Secondary | ICD-10-CM

## 2018-06-23 ENCOUNTER — Ambulatory Visit
Admission: RE | Admit: 2018-06-23 | Discharge: 2018-06-23 | Disposition: A | Discharge status: Home / Self Care | Payer: 59 | Source: Ambulatory Visit | Attending: Obstetrics and Gynecology | Admitting: Obstetrics and Gynecology

## 2018-06-23 DIAGNOSIS — Z1231 Encounter for screening mammogram for malignant neoplasm of breast: Secondary | ICD-10-CM

## 2018-11-18 ENCOUNTER — Other Ambulatory Visit: Payer: Self-pay

## 2018-11-18 ENCOUNTER — Ambulatory Visit
Admission: RE | Admit: 2018-11-18 | Discharge: 2018-11-18 | Disposition: A | Discharge status: Home / Self Care | Payer: 59 | Source: Ambulatory Visit | Attending: Sports Medicine | Admitting: Sports Medicine

## 2018-11-18 ENCOUNTER — Ambulatory Visit: Payer: 59 | Admitting: Sports Medicine

## 2018-11-18 ENCOUNTER — Ambulatory Visit: Payer: Self-pay

## 2018-11-18 VITALS — BP 120/80 | Ht 66.0 in | Wt 160.0 lb

## 2018-11-18 DIAGNOSIS — M25572 Pain in left ankle and joints of left foot: Secondary | ICD-10-CM | POA: Diagnosis not present

## 2018-11-18 DIAGNOSIS — S9002XA Contusion of left ankle, initial encounter: Secondary | ICD-10-CM | POA: Diagnosis not present

## 2018-11-19 ENCOUNTER — Encounter: Payer: Self-pay | Admitting: Sports Medicine

## 2018-11-19 NOTE — Progress Notes (Signed)
   Subjective:    Patient ID: Shannon Watts, female    DOB: [birth year], 58 y.o.   MRN: [id number]  HPI chief complaint: Left ankle pain  Shannon Watts comes in today after having injured her left ankle a little over a week ago.  While testing out a new lawnmower she accidentally ran into a concrete birdbath and part of the base struck her directly in the medial aspect of her left ankle.  She had immediate pain and developed swelling later.  She has been able to ambulate but her ankle has been quite stiff.  She has a history of a previous fracture in the same ankle.  She localizes all of her pain to the medial aspect of the lower leg just proximal to the medial malleolus.  Interim medical history reviewed Medications reviewed Allergies reviewed    Review of Systems    As above Objective:   Physical Exam  Well-developed, well-nourished.  No acute distress.  Awake alert and oriented x3.  Vital signs reviewed.  Left ankle: Patient has slightly limited range of motion secondary to pain but no joint effusion.  There is an area of soft tissue swelling in the distal tibia just proximal to the medial malleolus.  It is tender to palpation here.  No palpable hematoma.  No ecchymosis.  No skin abrasions.  No tenderness to palpation along the lateral ankle.  Good pulses.  Patient ambulates with a slight limp.  X-rays of the left ankle including AP, lateral, and mortise views show no acute fracture.  There is evidence of an old medial malleolar avulsion fracture and some mild degenerative changes.  Limited MSK ultrasound of the left lower leg was performed.  Painful area over the distal tibia was visualized in long and short axis.  No obvious cortical irregularity to suggest fracture.  There is some diffuse soft tissue edema but no obvious hematoma.  Findings consistent with probable contusion of the distal left tibia.      Assessment & Plan:   Contusion of the left lower distal tibia  Reassurance  regarding her ultrasound and x-ray findings.  No need for immobilization.  I have encouraged ankle range of motion and a return to activity as tolerated.  She may use over-the-counter analgesics and ice as needed for pain and will follow-up for ongoing or recalcitrant issues.  This note was dictated using Dragon naturally speaking software and may contain errors in syntax, spelling, or content which have not been identified prior to signing this note.

## 2019-01-13 ENCOUNTER — Ambulatory Visit: Payer: 59 | Admitting: Sports Medicine

## 2019-01-22 ENCOUNTER — Ambulatory Visit: Payer: 59 | Admitting: Sports Medicine

## 2019-01-22 ENCOUNTER — Encounter: Payer: Self-pay | Admitting: Sports Medicine

## 2019-01-22 ENCOUNTER — Other Ambulatory Visit: Payer: Self-pay

## 2019-01-22 VITALS — BP 138/82 | Ht 66.0 in | Wt 150.0 lb

## 2019-01-22 DIAGNOSIS — M545 Low back pain, unspecified: Secondary | ICD-10-CM

## 2019-01-22 MED ORDER — IBUPROFEN 800 MG PO TABS: ORAL_TABLET | ORAL | 0 refills | Status: DC

## 2019-01-22 MED ORDER — METHYLPREDNISOLONE ACETATE 80 MG/ML IJ SUSP
80.0000 mg | Freq: Once | INTRAMUSCULAR | Status: AC
  Administered 2019-01-22: 11:00:00 80 mg via INTRAMUSCULAR

## 2019-01-22 NOTE — Progress Notes (Signed)
   Subjective:    Patient ID: Shannon Watts, female    DOB: Dec 04, 1960, 58 y.o.   MRN: [id number]  HPI chief complaint: Low back pain  Shannon Watts comes in today complaining of 2 weeks of low back pain.  She began to experience pain after a boot camp exercise.  She states that she was pulling tires for exercise and the following day developed discomfort across her low back.  2 days later she began to develop spasm.  Since that time her symptoms have improved but not resolved.  She still getting intermittent spasms but has been able to return to some limited workouts.  Pain is spasm is diffuse across her low back.  Denies radiating pain into her legs.  No numbness or tingling.  Ibuprofen does seem to help.  Heat is also helpful.  She has had problems with her low back in the past.  She had an MRI done many years ago which showed some degenerative disc disease.  No previous low back surgeries.  Interim medical history reviewed Medications reviewed Allergies reviewed   Review of Systems As above    Objective:   Physical Exam  Well-developed, well-nourished.  No acute distress.  Awake alert and oriented x3.  Vital signs reviewed.  Lumbar spine: Full lumbar range of motion.  No tenderness to palpation along the lumbar midline.  There is spasm of the paraspinal musculature bilaterally.  Negative straight leg raise.  No focal neurological deficit of either lower extremity.      Assessment & Plan:   Low back pain likely secondary to lumbar strain versus lumbar degenerative disc disease  Patient symptoms are already improving.  I did offer her an IM Depo-Medrol injection today and she accepted.  I have also given her prescription for ibuprofen 800 mg with instructions to take 1 pill 2-3 times a day with food.  She will be very careful with her return to the gym.  I would expect another 2 to 3 weeks before she is able to fully participate.  If symptoms persist or worsen then I would consider formal  physical therapy.  Follow-up for ongoing or recalcitrant issues.

## 2019-03-03 ENCOUNTER — Ambulatory Visit: Payer: 59 | Admitting: Sports Medicine

## 2019-03-03 ENCOUNTER — Other Ambulatory Visit: Payer: Self-pay

## 2019-03-03 VITALS — BP 132/90 | Ht 66.0 in | Wt 155.0 lb

## 2019-03-03 DIAGNOSIS — M545 Low back pain, unspecified: Secondary | ICD-10-CM

## 2019-03-03 MED ORDER — METHYLPREDNISOLONE ACETATE 80 MG/ML IJ SUSP
80.0000 mg | Freq: Once | INTRAMUSCULAR | Status: AC
  Administered 2019-03-03: 80 mg via INTRAMUSCULAR

## 2019-03-03 NOTE — Progress Notes (Signed)
   Subjective:    Patient ID: Shannon Watts, female    DOB: 04-11-1961, 58 y.o.   MRN: [id number]  HPI chief complaint: Low back pain  [name] comes in today requesting a repeat IM Depo-Medrol injection for low back pain.  Last injection was about a month ago.  She did notice some improvement but is asking for a second injection.  Pain is diffuse across the low back.  No radiating pain into her legs.  No numbness or tingling. She is also complaining of some anterior right thigh and hip pain that began during a boot camp.  While performing a lunge she felt an acute pop in the right anterior hip.  Since then she has had pain and a feeling of tightness in her quad with activity.  Injury occurred a few days ago and is improving.  She did not notice any swelling or bruising.    Review of Systems As above    Objective:   Physical Exam  Well-developed, well-nourished.  No acute distress.  Awake alert and oriented x3.  Vital signs reviewed.  Lumbar spine: Good lumbar range of motion.  Right hip: Smooth painless hip range of motion.  Negative logroll.  Good strength.  No palpable defect.  No obvious swelling.  Neurovascular intact distally.  Walking without a noticeable limp.      Assessment & Plan:   Low back pain likely secondary to lumbar strain versus lumbar degenerative disc disease Right hip pain secondary to hip flexor strain  Depo-Medrol 80 mg IM today.  Patient had an MRI done several years ago at [facility].  I requested that report.  If low back pain persists consider x-rays and formal physical therapy. We will take a watchful waiting approach on the right hip flexor strain.  If symptoms do not continue to improve then patient will return to the office for ultrasound evaluation. Follow-up for ongoing or recalcitrant issues.

## 2019-04-30 ENCOUNTER — Ambulatory Visit (INDEPENDENT_AMBULATORY_CARE_PROVIDER_SITE_OTHER): Payer: 59 | Admitting: Sports Medicine

## 2019-04-30 ENCOUNTER — Other Ambulatory Visit: Payer: Self-pay

## 2019-04-30 DIAGNOSIS — M722 Plantar fascial fibromatosis: Secondary | ICD-10-CM

## 2019-04-30 MED ORDER — IBUPROFEN 800 MG PO TABS: ORAL_TABLET | ORAL | 0 refills | Status: DC

## 2019-04-30 NOTE — Progress Notes (Signed)
   HPI  CC: Left heel pain  Patient has a history of plantar fasciitis in the left heel, that had improved with anti-inflammatories and orthotics.  On Christmas Eve the patient was wearing a pair of Hunter boots and on the next day she wore a pair of Birkenstocks boots.  The day after Christmas she had very tender pain in the heel of her left foot.  She did not do any strenuous exercise or was not on her feet and extended amount of time during that period.  Pain is on the plantar side of the back of her heel.  It hurts with walking when her heel strikes the ground and also hurts when something touches it, especially at night such as the sheets or her other foot.  Pain is not every day but has not improved since this time.  She has tried ice, store-bought orthotics (she cannot find the custom orthotic she had made here).  Has not tried any medications yet.   Medications/Interventions Tried: Stretching, ice  See HPI and/or previous note for associated ROS.  Objective: BP 136/84   Ht 5\' 6"  (1.676 m)   Wt 160 lb (72.6 kg)   BMI 25.82 kg/m  Gen: NAD, well groomed, a/o x3, normal affect.  CV: Well-perfused. Warm.  Resp: Non-labored.  Neuro: Sensation intact throughout left foot and ankle.   MSK: No swelling or gross abnormality on inspection of the left foot and ankle.  Range of motion normal.  5/5 strength in the plantarflexion and dorsiflexion.  Tenderness to palpation isolated over the pad of the left heel on the most posterior aspect.  Calcaneal squeeze negative.  Ultrasound: On ultrasound the plantar tendon has fluid throughout.  No thickening noted.  No tears noted.  Assessment and plan:  PLANTAR FASCIITIS, LEFT Symptoms consistent with a recurrence of her plantar fasciitis.  Most likely due to the shoes she was wearing during the time.  Ultrasound showed fluid throughout the plantar tendon consistent with inflammation. -Ibuprofen -Heel cups provided today -Custom orthotics ordered  for her size.  Will call patient when they arrive so she can be fitted.    Meds ordered this encounter  Medications  . ibuprofen (ADVIL) 800 MG tablet    Sig: Take 1 tablet TID prn pain with food    Dispense:  60 tablet    Refill:  0     , MD, PGY 2 [facility] 04/30/2019 9:56 AM  Patient seen and evaluated with the resident.  I agree with the above plan of care.  We will order 05/02/2019 a pair of smart cell orthotics and we will call her when they arrive.  In the meantime, heel cups were provided and she is instructed to continue icing.  She will also start plantar fascial stretching and I will refill her 800 mg ibuprofen to take as needed.

## 2019-04-30 NOTE — Assessment & Plan Note (Signed)
Symptoms consistent with a recurrence of her plantar fasciitis.  Most likely due to the shoes she was wearing during the time.  Ultrasound showed fluid throughout the plantar tendon consistent with inflammation. -Ibuprofen -Heel cups provided today -Custom orthotics ordered for her size.  Will call patient when they arrive so she can be fitted.

## 2019-04-30 NOTE — Patient Instructions (Signed)
It was nice to see you today,  Your heel pain is most consistent with a recurrence of your plantar fasciitis.  We have provided you with cushioned heel cups today and have ordered some custom orthotics, which we will have you come back and get fitted for when they arrive.  In the meantime, you can take ibuprofen 600 mg every 6 hours to help with pain and inflammation.  Please try to avoid wearing multiple different types of shoes during this time.  We will call you when the orthotics are ready so that you can come in and get fitted.  Have a great day

## 2019-05-12 ENCOUNTER — Other Ambulatory Visit: Payer: Self-pay

## 2019-05-12 ENCOUNTER — Ambulatory Visit (INDEPENDENT_AMBULATORY_CARE_PROVIDER_SITE_OTHER): Payer: 59 | Admitting: Sports Medicine

## 2019-05-12 VITALS — BP 138/82 | Ht 66.0 in | Wt 160.0 lb

## 2019-05-12 DIAGNOSIS — M25579 Pain in unspecified ankle and joints of unspecified foot: Secondary | ICD-10-CM

## 2019-05-12 DIAGNOSIS — M722 Plantar fascial fibromatosis: Secondary | ICD-10-CM

## 2019-05-13 NOTE — Progress Notes (Signed)
Patient ID: Shannon Watts, female   DOB: 11/30/1960, 59 y.o.   MRN: [id number]  [name] comes in today for custom orthotics.  Please see the office note from January 14 for details regarding history and physical exam findings.  She has a history of plantar fasciitis which is slowly improving.  Custom orthotics were created for [place] today.  She has had custom orthotics in the past but they were too thick and heavy.  Therefore, we elected to use smart cell technology and elected not to add a blue EVA base.  She found them to be comfortable prior to leaving the office.  Follow-up as needed.  Patient was fitted for a : standard, cushioned, semi-rigid orthotic. The orthotic was heated and afterward the patient stood on the orthotic blank positioned on the orthotic stand. The patient was positioned in subtalar neutral position and 10 degrees of ankle dorsiflexion in a weight bearing stance. After completion of molding, a stable base was applied to the orthotic blank. The blank was ground to a stable position for weight bearing. Size: 8 Base: none Posting: none Additional orthotic padding: none

## 2019-05-14 ENCOUNTER — Encounter: Payer: 59 | Admitting: Sports Medicine

## 2019-05-15 ENCOUNTER — Other Ambulatory Visit: Payer: Self-pay | Admitting: Obstetrics and Gynecology

## 2019-05-15 DIAGNOSIS — Z1231 Encounter for screening mammogram for malignant neoplasm of breast: Secondary | ICD-10-CM

## 2019-06-24 ENCOUNTER — Ambulatory Visit
Admission: RE | Admit: 2019-06-24 | Discharge: 2019-06-24 | Disposition: A | Discharge status: Home / Self Care | Payer: 59 | Source: Ambulatory Visit | Attending: Obstetrics and Gynecology | Admitting: Obstetrics and Gynecology

## 2019-06-24 ENCOUNTER — Other Ambulatory Visit: Payer: Self-pay

## 2019-06-24 DIAGNOSIS — Z1231 Encounter for screening mammogram for malignant neoplasm of breast: Secondary | ICD-10-CM

## 2019-07-03 ENCOUNTER — Ambulatory Visit: Payer: 59 | Attending: Internal Medicine

## 2019-07-03 DIAGNOSIS — Z23 Encounter for immunization: Secondary | ICD-10-CM

## 2019-07-03 NOTE — Progress Notes (Signed)
   Covid-19 Vaccination Clinic  Name:  Kimble Delaurentis    MRN: [id number] DOB: 11/10/1960  07/03/2019  Ms. Cinelli was observed post Covid-19 immunization for 15 minutes without incident. She was provided with Vaccine Information Sheet and instruction to access the V-Safe system.   Ms. Sparlin was instructed to call 911 with any severe reactions post vaccine: Marland Kitchen Difficulty breathing  . Swelling of face and throat  . A fast heartbeat  . A bad rash all over body  . Dizziness and weakness   Immunizations Administered    Name Date Dose VIS Date Route   Pfizer COVID-19 Vaccine 07/03/2019  8:25 AM 0.3 mL 03/27/2019 Intramuscular   Manufacturer: [employer], [employer]   Lot: [id number]   NDC: [address]-8

## 2019-07-28 ENCOUNTER — Ambulatory Visit: Payer: 59 | Attending: Internal Medicine

## 2019-07-28 DIAGNOSIS — Z23 Encounter for immunization: Secondary | ICD-10-CM

## 2019-07-28 NOTE — Progress Notes (Signed)
   Covid-19 Vaccination Clinic  Name:  Alaysiah Browder    MRN: [id number] DOB: Dec 12, 1960  07/28/2019  Ms. Stolar was observed post Covid-19 immunization for 15 minutes without incident. She was provided with Vaccine Information Sheet and instruction to access the V-Safe system.   Ms. Marseille was instructed to call 911 with any severe reactions post vaccine: Marland Kitchen Difficulty breathing  . Swelling of face and throat  . A fast heartbeat  . A bad rash all over body  . Dizziness and weakness   Immunizations Administered    Name Date Dose VIS Date Route   Pfizer COVID-19 Vaccine 07/28/2019  8:37 AM 0.3 mL 03/27/2019 Intramuscular   Manufacturer: [employer], [employer]   Lot: [id number]   NDC: [address]-8

## 2019-09-01 ENCOUNTER — Other Ambulatory Visit: Payer: Self-pay

## 2019-09-01 ENCOUNTER — Ambulatory Visit (INDEPENDENT_AMBULATORY_CARE_PROVIDER_SITE_OTHER): Payer: 59 | Admitting: Sports Medicine

## 2019-09-01 VITALS — BP 134/80 | Ht 66.0 in | Wt 165.0 lb

## 2019-09-01 DIAGNOSIS — G8929 Other chronic pain: Secondary | ICD-10-CM

## 2019-09-01 DIAGNOSIS — M79672 Pain in left foot: Secondary | ICD-10-CM | POA: Diagnosis not present

## 2019-09-01 MED ORDER — IBUPROFEN 800 MG PO TABS: 800.0000 mg | ORAL_TABLET | Freq: Two times a day (BID) | ORAL | 1 refills | Status: DC | PRN

## 2019-09-01 NOTE — Progress Notes (Addendum)
    SUBJECTIVE:   CHIEF COMPLAINT / HPI:   Patient presents for follow-up for left foot pain.  She was seen in January and was diagnosed with plantar fasciitis.  She was given custom orthotics, heel cups.  Patient states that she is able to use the heel cups but has not been able to use the orthotics as they "fill up my shoe too much".  She describes the pain as primarily on the back lower aspect of her heel.  It comes and goes.  Will last for 3 days at a time.  Then go away.  It is improved with ibuprofen.  Describes the pain as a soreness.   OBJECTIVE:   BP 134/80   Ht 5\' 6"  (1.676 m)   Wt 165 lb (74.8 kg)   BMI 26.63 kg/m    Left foot Inspection: Haglund's deformity of left lateral posterior foot Palpation: heel, achilles nontender to palpation ROM: full range of motion plantar flexion, dorsiflexion, inversion and eversion Strength: normal strength throughout Stability: joint stable Special tests: neg drawer sign,  Vascular studies: NVI   ASSESSMENT/PLAN:   Chronic posterior left heel pain Likely from prior Achilles injury.  - continue to use shoe orthotics and Achilles cups - ibuprofen prn - follow up prn  , MD [facility]   Patient seen and evaluated with the resident.  I agree with the above plan of care.  Shannon Watts localizes her pain to the posterior aspect of the left calcaneus.  She does have a Haglund's deformity near this area but is nontender directly over this.  Ultrasound today of her plantar fascia shows mild thickening but her clinical picture does not fit plantar fasciitis.  Haglund's deformity is well seen on ultrasound as well but the remainder of her Achilles tendon is unremarkable.  We were able to make her orthotics more comfortable by readjusting the laces on her shoes.  I have given her a set of Alfredson heel drop exercises to start doing daily and she may also use a heel lift in her other shoes.  I think she is okay to  continue with activity as tolerated.  She is requesting a refill on her ibuprofen 800 mg which she takes only as needed.  Follow-up for ongoing or recalcitrant issues.

## 2019-09-22 ENCOUNTER — Other Ambulatory Visit: Payer: Self-pay

## 2019-09-22 ENCOUNTER — Ambulatory Visit: Payer: 59 | Admitting: Sports Medicine

## 2019-09-22 VITALS — BP 148/92 | Ht 66.0 in | Wt 160.0 lb

## 2019-09-22 DIAGNOSIS — G5622 Lesion of ulnar nerve, left upper limb: Secondary | ICD-10-CM

## 2019-09-22 MED ORDER — PREDNISONE 10 MG PO TABS: ORAL_TABLET | ORAL | 0 refills | Status: DC

## 2019-09-23 NOTE — Progress Notes (Signed)
   Subjective:    Patient ID: Shannon Watts, female    DOB: 09/09/1960, 59 y.o.   MRN: [id number]  HPI chief complaint: Left hand numbness  [name] comes in today complaining of a few days of numbness and tingling in the left hand, specifically in the left fifth finger.  She tends to put pressure over the cubital tunnel when working or sitting.  She was doing this a few days ago when she noticed numbness in her left hand.  Numbness has persisted.  It is making typing difficult.  She has not noticed any weakness.  She has had similar episodes in the past but they resolved spontaneously.  She is also getting some numbness and tingling along the ulnar aspect of the distal forearm.  Denies any pain at the elbow.    Review of Systems    As above Objective:   Physical Exam  Well-developed, well-nourished.  No acute distress.  Left elbow: Full range of motion.  No effusion.  Negative Tinel's at the cubital tunnel.  Neurological exam shows diffuse numbness to light touch along the fifth finger of the left hand.  She does have good grip.  No atrophy.  Good pulses.      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  Therefore, we will try a 6-day Sterapred Dosepak.  She will purchase an elbow pad to help protect the cubital tunnel and I have educated her about the importance of not resting on her elbow when sitting or typing.  She understands.  She will follow-up for ongoing or recalcitrant issues.

## 2019-09-28 ENCOUNTER — Other Ambulatory Visit: Payer: Self-pay | Admitting: *Deleted

## 2019-09-28 MED ORDER — PREDNISONE 10 MG (21) PO TBPK: ORAL_TABLET | ORAL | 0 refills | Status: DC

## 2019-10-22 ENCOUNTER — Ambulatory Visit: Payer: 59 | Admitting: Sports Medicine

## 2019-10-27 ENCOUNTER — Other Ambulatory Visit: Payer: Self-pay

## 2019-10-27 ENCOUNTER — Ambulatory Visit (INDEPENDENT_AMBULATORY_CARE_PROVIDER_SITE_OTHER): Payer: 59 | Admitting: Sports Medicine

## 2019-10-27 VITALS — BP 132/84 | Ht 66.0 in | Wt 160.0 lb

## 2019-10-27 DIAGNOSIS — G5622 Lesion of ulnar nerve, left upper limb: Secondary | ICD-10-CM

## 2019-10-27 NOTE — Patient Instructions (Addendum)
We are going to refer you to a hand specialist to further evaluate your left hand numbness.  [facility] [address] [phone]  Appt: Monday 11/02/19 at 9:30 am with Dr. Cindee Salt. Please arrive at 9:15 am. If you are able to, please print out the paperwork they emailed you to fill out and bring to your appt.

## 2019-10-28 NOTE — Progress Notes (Signed)
   Subjective:    Patient ID: Shannon Watts, female    DOB: 1960/08/31, 59 y.o.   MRN: [id number]  HPI   [name] comes in today with persistent numbness and tingling in the left fifth finger.  Symptoms persist despite two rounds of oral steroids.  Although her symptoms initially began at the elbow, she is no longer experiencing any pain in the elbow or forearm.  Symptoms are concentrated primarily along the ulnar aspect of the left hand and into the fifth finger as well as the ulnar aspect of the fourth finger 2.  She has not noticed any weakness.  Her symptoms are intermittent but can occur with light exercise such as running.  She is using an elbow pad as previously recommended to protect the cubital tunnel.    Review of Systems As above    Objective:   Physical Exam  Well-developed, well-nourished.  No acute distress.  Left hand: There is decreased sensation to light touch along the ulnar aspect of the left hand and the left fifth finger.  Decreased sensation along the ulnar aspect of the left fourth finger as well.  There is a slight amount of weakness appreciated with resisted fifth finger flexion when compared to the uninvolved right hand.  Also weakness with resisted fifth finger abduction.  I do not appreciate any atrophy in the hand.  Good pulses.  She does have a positive Tinel's at Guyon's canal.  Negative Tinel's at the cubital tunnel.      Assessment & Plan:   Persistent left hand and fifth finger numbness secondary to ulnar neuropathy  At this point I am not sure if her symptoms are originating at the elbow or at Guyon's canal.  She does have some weakness on exam today and her symptoms have been present for about 6 weeks.  Therefore, I will refer her to Dr. Merlyn Lot for further evaluation and treatment.  She has an appointment with him early next week.  She will follow up with me as needed.

## 2020-04-26 ENCOUNTER — Ambulatory Visit: Payer: 59 | Admitting: Sports Medicine

## 2020-04-26 ENCOUNTER — Other Ambulatory Visit: Payer: Self-pay

## 2020-04-26 VITALS — BP 124/82 | Ht 66.0 in | Wt 160.0 lb

## 2020-04-26 DIAGNOSIS — M79674 Pain in right toe(s): Secondary | ICD-10-CM

## 2020-04-27 NOTE — Progress Notes (Signed)
   Subjective:    Patient ID: Baillie Mohammad, female    DOB: 1960-05-02, 60 y.o.   MRN: [id number]  HPI chief complaint: Right fifth toe pain  [name] comes in today having injured her right fifth toe 3 days ago.  When getting out of the bed, she suffered a hyperextension injury to the toe when she caught it on the carpet.  Immediate pain.  She then developed swelling and bruising at the fifth toe.  She has been buddy taping.  She denies pain elsewhere through the foot.   Review of Systems    As above Objective:   Physical Exam  Well-developed, well-nourished.  No acute distress  Right foot: Physical exam of the right foot with attention to the right fifth toe does show mild swelling diffusely throughout the toe.  Mild ecchymosis as well.  No obvious deformity.  She is tender to palpation at the proximal phalanx and fifth metatarsal head.  Brisk capillary refill.  No evidence of subungual hematoma.  Limited MSK ultrasound of the right fifth toe shows no obvious fracture      Assessment & Plan:   Right fifth toe pain secondary to contusion versus occult fracture  Although I do not see an obvious fracture on ultrasound, there is the possibility that she could have an occult fracture.  She has no clinical angulation or malrotation however.  I recommended that she continue buddy taping and wearing good supportive shoes until her pain improves.  I think she is okay to resume activity as tolerated, including exercise, as long as she is not limping.  She will follow-up for ongoing or recalcitrant issues.

## 2020-05-20 ENCOUNTER — Other Ambulatory Visit: Payer: Self-pay | Admitting: Obstetrics and Gynecology

## 2020-05-20 DIAGNOSIS — Z1231 Encounter for screening mammogram for malignant neoplasm of breast: Secondary | ICD-10-CM

## 2020-06-10 ENCOUNTER — Other Ambulatory Visit: Payer: Self-pay | Admitting: Urology

## 2020-06-14 ENCOUNTER — Other Ambulatory Visit: Payer: Self-pay

## 2020-06-14 DIAGNOSIS — N200 Calculus of kidney: Secondary | ICD-10-CM

## 2020-06-14 MED ORDER — POTASSIUM CITRATE ER 15 MEQ (1620 MG) PO TBCR: 1.0000 | EXTENDED_RELEASE_TABLET | Freq: Every day | ORAL | 2 refills | Status: DC

## 2020-07-08 ENCOUNTER — Ambulatory Visit
Admission: RE | Admit: 2020-07-08 | Discharge: 2020-07-08 | Disposition: A | Discharge status: Home / Self Care | Payer: 59 | Source: Ambulatory Visit | Attending: Obstetrics and Gynecology | Admitting: Obstetrics and Gynecology

## 2020-07-08 ENCOUNTER — Other Ambulatory Visit: Payer: Self-pay

## 2020-07-08 DIAGNOSIS — Z1231 Encounter for screening mammogram for malignant neoplasm of breast: Secondary | ICD-10-CM

## 2020-08-30 ENCOUNTER — Ambulatory Visit: Payer: 59 | Admitting: Sports Medicine

## 2020-09-06 ENCOUNTER — Ambulatory Visit (INDEPENDENT_AMBULATORY_CARE_PROVIDER_SITE_OTHER): Payer: 59 | Admitting: Sports Medicine

## 2020-09-06 ENCOUNTER — Other Ambulatory Visit: Payer: Self-pay

## 2020-09-06 ENCOUNTER — Encounter: Payer: Self-pay | Admitting: Sports Medicine

## 2020-09-06 VITALS — BP 160/100 | Ht 66.0 in | Wt 165.0 lb

## 2020-09-06 DIAGNOSIS — G5702 Lesion of sciatic nerve, left lower limb: Secondary | ICD-10-CM | POA: Diagnosis not present

## 2020-09-06 DIAGNOSIS — M545 Low back pain, unspecified: Secondary | ICD-10-CM | POA: Insufficient documentation

## 2020-09-06 MED ORDER — IBUPROFEN 800 MG PO TABS: 800.0000 mg | ORAL_TABLET | Freq: Two times a day (BID) | ORAL | 1 refills | Status: DC | PRN

## 2020-09-06 NOTE — Progress Notes (Signed)
  Shannon Watts - 60 y.o. female MRN [id number]  Date of birth: [birth year]  SUBJECTIVE:  CC: Low Back Pain  Shannon Watts presents today with complaints of pain in her low back since 3 weeks ago when she felt some tightness after a walk with her sister. She reports that in her left gluteal region she has has pain that has been on and off that feels like tightness. Since that time she has stopped some activity due to the pain. The pain is worse with activity and walking and better at rest while laying down. She has not taken anything for the pain. It is not improved or worsened with flexion or extension of the spine.  She denies any numbness, tingling, or weakness. The pain does not radiate down her leg. 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   Review of systems as above.    Objective:  VS: BP:(!) 160/100  HR: bpm  TEMP: ( )  RESP:   HT:5\' 6"  (167.6 cm)   WT:165 lb (74.8 kg)  BMI:26.64 PHYSICAL EXAM:  General: Well appearing and in no apparent distress. A&O x 3.  Lumbar Spine: FROM. nontender to palpation along the midline. No paraspinal tenderness. No CVA tenderness.   Left Hip: FROM. Tender to palpation at the level of the piriformis. No SI tenderness. Negative Pearlean Brownie and [employer]. No neurological deficits in the hip or leg.   ASSESSMENT & PLAN:    1. Piriformis Syndrome: The location and mechanism of her pain points towards piriformis syndrome as the most likely etiology. We will start with conservative measures. We will prescribe 800mg  ibuprofen to be taken BID prn. She will be sent home with at home exercises for piriformis syndrome to perform daily. She can do activity as tolerated. If these measures do not resolve the pain within 2 weeks she can call and we can call in a referral for PT. Otherwise, she can follow up on an as needed basis.   [place], MS4  Patient seen and evaluated with the medical student.  I  agree with the above plan of care.  If symptoms persist consider formal physical therapy.  Follow-up for ongoing or recalcitrant issues.

## 2020-09-06 NOTE — Patient Instructions (Signed)
You were seen today for pain in your left gluteal area. You are most likely experiencing symptoms from piriformis syndrome which is inflammation of one of your muscles.   We are going to prescribe you 800mg  Ibuprofen to take up to two times a day as needed to help relieve your pain. We will also provide you with stretching exercises to do daily. You can resume activities as tolerated.  If you are still experiencing symptoms after 2 weeks, please call and we can put in a referral to a physical therapist. Otherwise, you may follow up as needed.

## 2021-02-24 ENCOUNTER — Ambulatory Visit: Payer: 59 | Admitting: Urology

## 2021-04-07 ENCOUNTER — Ambulatory Visit: Payer: 59 | Admitting: Urology

## 2021-05-19 ENCOUNTER — Ambulatory Visit: Payer: 59 | Admitting: Urology

## 2021-05-29 ENCOUNTER — Ambulatory Visit: Payer: 59 | Admitting: Sports Medicine

## 2021-05-29 VITALS — BP 148/96 | Ht 66.0 in | Wt 170.0 lb

## 2021-05-29 DIAGNOSIS — Q828 Other specified congenital malformations of skin: Secondary | ICD-10-CM

## 2021-05-29 DIAGNOSIS — M79672 Pain in left foot: Secondary | ICD-10-CM

## 2021-05-30 NOTE — Progress Notes (Signed)
° °  Subjective:    Patient ID: Shannon Watts, female    DOB: 03-14-1961, 61 y.o.   MRN: [id number]  HPI chief complaint: Left foot pain  Cindi presents today complaining of a palpable bump along the plantar aspect of her left foot.  No recent trauma.  It is located along the midportion of the plantar fascia.  It is mildly tender to palpation.  Been present for about a month.  No specific treatment to date.  She has a history of Plantar fasciitis but this is different than her previous plantar fascial pain  Interim medical history reviewed Medications reviewed Allergies reviewed    Review of Systems As above    Objective:   Physical Exam  Well-developed, well-nourished.  No acute distress  Left foot: There is a palpable plantar keratosis along the midportion of the plantar fascia.  No significant tenderness to palpation.  No erythema.  No other bony or soft tissue tenderness to palpation throughout the foot including at the origin of the plantar fascia.  Good pulses.      Assessment & Plan:   Left foot plantar keratosis/fibroma  Patient was provided with a plantar keratosis pad to wear in her shoes.  We provided her with the information for [employer] in case she would like to order additional padding.  If her symptoms persist or worsen then consider referral to podiatry.  Follow-up for ongoing or recalcitrant issues.  This note was dictated using Dragon naturally speaking software and may contain errors in syntax, spelling, or content which have not been identified prior to signing this note.

## 2021-06-01 ENCOUNTER — Other Ambulatory Visit: Payer: Self-pay | Admitting: Obstetrics and Gynecology

## 2021-06-01 DIAGNOSIS — Z1231 Encounter for screening mammogram for malignant neoplasm of breast: Secondary | ICD-10-CM

## 2021-06-16 ENCOUNTER — Ambulatory Visit: Payer: 59 | Admitting: Urology

## 2021-07-14 ENCOUNTER — Ambulatory Visit
Admission: RE | Admit: 2021-07-14 | Discharge: 2021-07-14 | Disposition: A | Discharge status: Home / Self Care | Payer: 59 | Source: Ambulatory Visit | Attending: Obstetrics and Gynecology | Admitting: Obstetrics and Gynecology

## 2021-07-14 DIAGNOSIS — Z1231 Encounter for screening mammogram for malignant neoplasm of breast: Secondary | ICD-10-CM

## 2021-12-28 ENCOUNTER — Ambulatory Visit: Payer: 59 | Admitting: Sports Medicine

## 2021-12-28 VITALS — BP 136/80 | Ht 66.0 in

## 2021-12-28 DIAGNOSIS — M7551 Bursitis of right shoulder: Secondary | ICD-10-CM

## 2021-12-28 MED ORDER — IBUPROFEN 800 MG PO TABS: 800.0000 mg | ORAL_TABLET | Freq: Two times a day (BID) | ORAL | 1 refills | Status: DC | PRN

## 2021-12-28 NOTE — Patient Instructions (Signed)
You have subacromial bursitis near your rotator cuff Try to avoid painful activities (overhead activities, lifting with extended arm) as much as possible. Take ibuprofen with food for pain and inflammation 2-3x per day as needed. Can take tylenol in addition to this. Subacromial injection may be beneficial to help with pain and to decrease inflammation. Start home exercise program. Do home exercise program with theraband and scapular stabilization exercises daily 3 sets of 10 once a day. If not improving at follow-up we will consider formal PT, further imaging and injections  Follow up with me in 2-4 weeks

## 2021-12-28 NOTE — Progress Notes (Signed)
  Judianne Watts - 61 y.o. female MRN [id number]  Date of birth: Mar 11, 1961    CHIEF COMPLAINT:   R shoulder pain ; L foot irritation    SUBJECTIVE:   HPI:  Pleasant 61 year old female comes clinic to evaluate for right shoulder pain and left foot pain.  Her right shoulder has been bothering her for about 2 weeks now.  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  She now feels the pulling achy pain on the lateral aspect of the right shoulder.  It is worse with overhead movements.  She has not tried any medicines, ice, or therapies for this yet.  Of note, she has had a history of subacromial bursitis in the left shoulder and has had injection in that shoulder before.  Regards to the left foot, she has noticed small bony bump on the dorsum of the foot.  It is not overly painful but it does get rubbed by certain shoewear.  She denies any injury or trauma to the foot.  ROS:     See HPI  PERTINENT  PMH / PSH FH / / SH:  Past Medical, Surgical, Social, and Family History Reviewed & Updated in the EMR.  Pertinent findings include:  none  OBJECTIVE: BP 136/80   Ht 5\' 6"  (1.676 m)   BMI 27.44 kg/m   Physical Exam:  Vital signs are reviewed.  GEN: Alert and oriented, NAD Pulm: Breathing unlabored PSY: normal mood, congruent affect  MSK: R Shoulder - no deformity.  There is no tenderness to palpation over the [facility] joint.  Mildly tender to palpation over biceps in the bicipital groove.  She has full range of motion in forward flexion and abduction but positive painful arc. Full ROM external and internal rotation.  Pain with resisted external rotation.  Positive Hawkins test.  Negative empty can test.  Negative O'Brien's test. Mildly painful Speed test.  Neurovascularly intact distally.  L Foot - small bony prominence over the dorsum of foot.  No overlying erythema or ecchymoses.  Nontender to palpation  there.  Full range of motion at the ankle.  5/5 strength with resistance.  Neurovascular intact distally  ASSESSMENT & PLAN:  1. R Shoulder Subacromial Bursitis -We will have her start home exercise plan to strengthen her rotator cuff and reduce inflammation around rotator cuff.  She will also start ibuprofen 800 mg 2-3 times daily with food as needed for pain.  If no improvement after 2 weeks, we will have her come back to clinic to consider formal physical therapy and a subacromial bursa injection.   2. 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  We discussed using moleskin or body glide on top of the bony prominence to help reduce friction in her shoes.  All questions answered she agrees to plan.  [facility], MD PGY-4, Sports Medicine Fellow [facility]  Patient seen and evaluated with the sports medicine fellow.  I agree with the above plan of care.  If symptoms persist for another 2 weeks despite oral ibuprofen and home exercises then consider subacromial cortisone injection.  Follow-up for ongoing or recalcitrant issues.

## 2022-01-15 IMAGING — MG DIGITAL SCREENING BILAT W/ TOMO W/ CAD
8 series · 9 of 24 positions shown · non-contrast
Comparison: Previous exam(s).

CLINICAL DATA: Screening.

EXAM:
DIGITAL SCREENING BILATERAL MAMMOGRAM WITH TOMO AND CAD

[R MLO synth-2D]
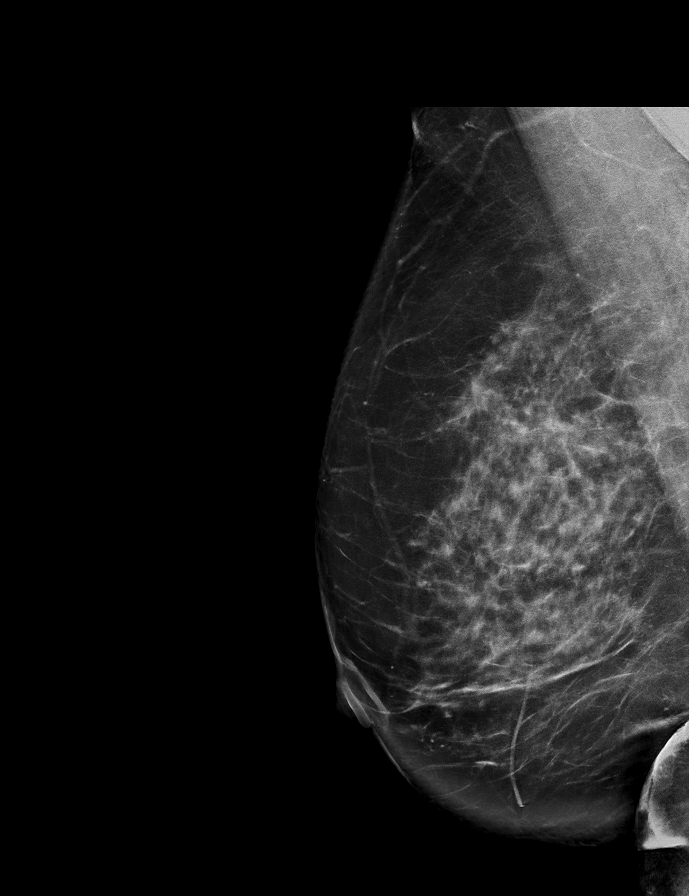

[L CC synth-2D]
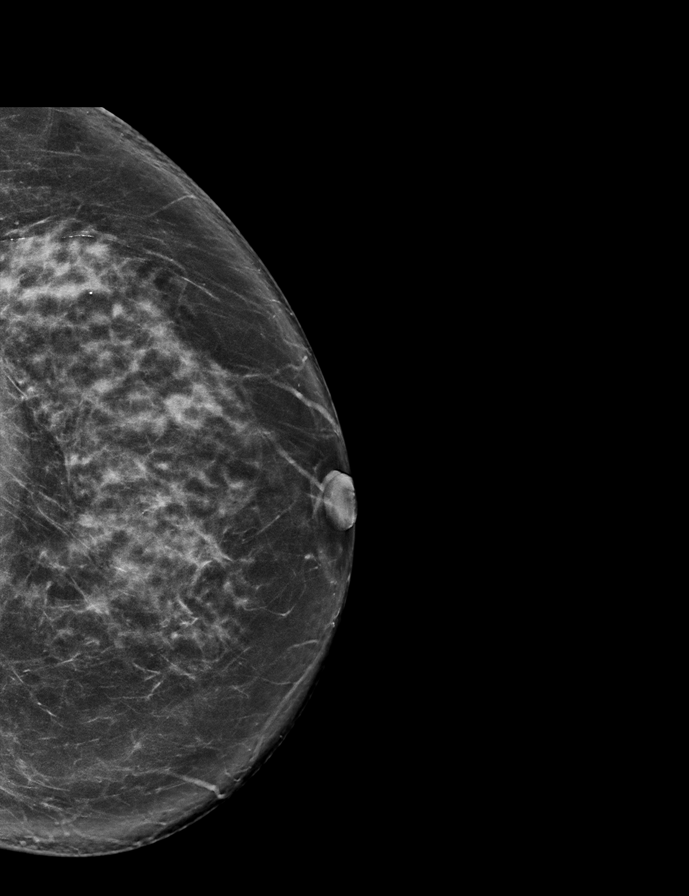

[L MLO synth-2D]
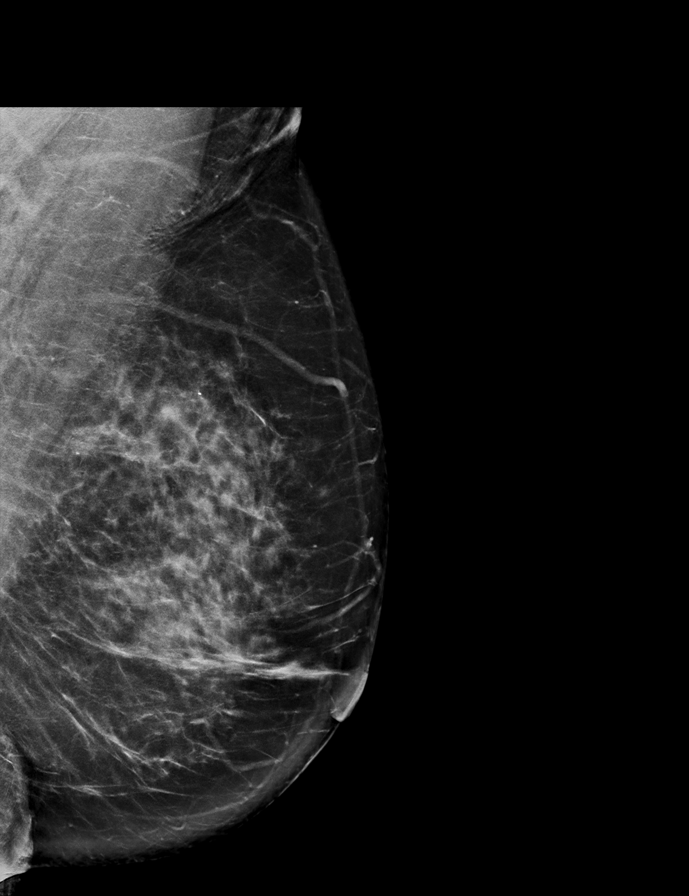

[R CC synth-2D]
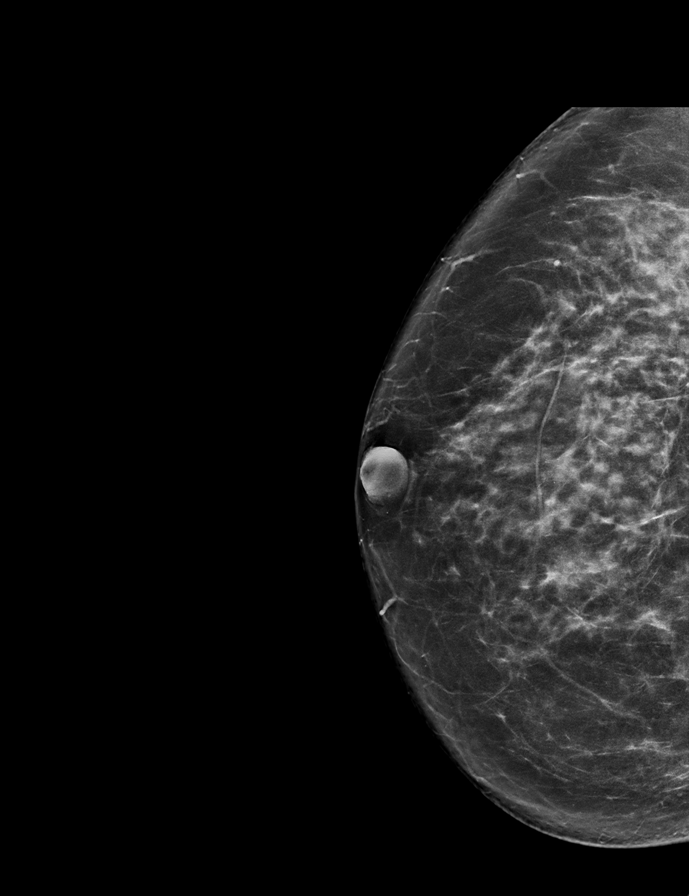

[R MLO tomo · 2 of 88 frames shown]
[frame 29/88]
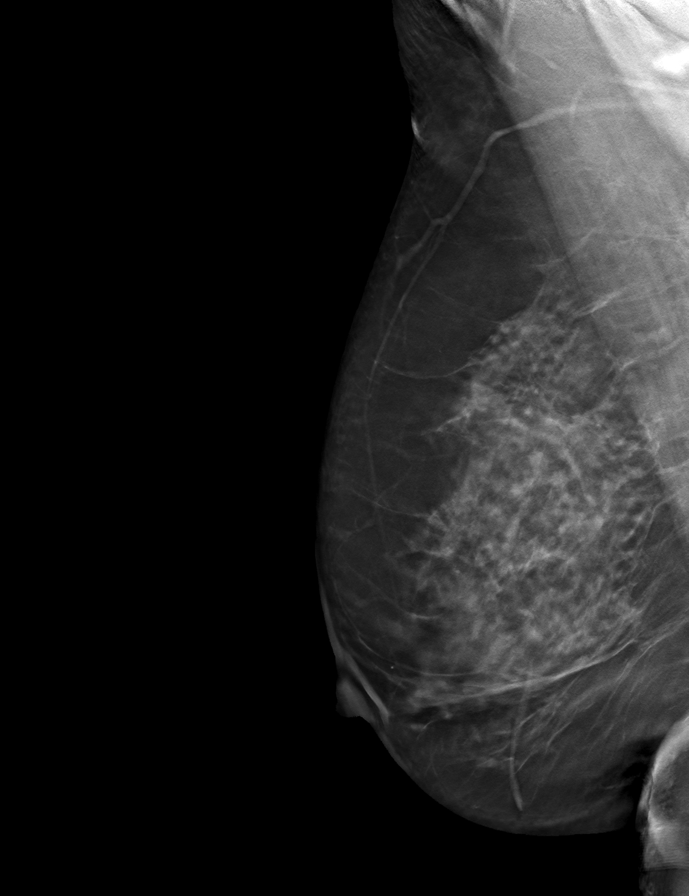
[frame 45/88]
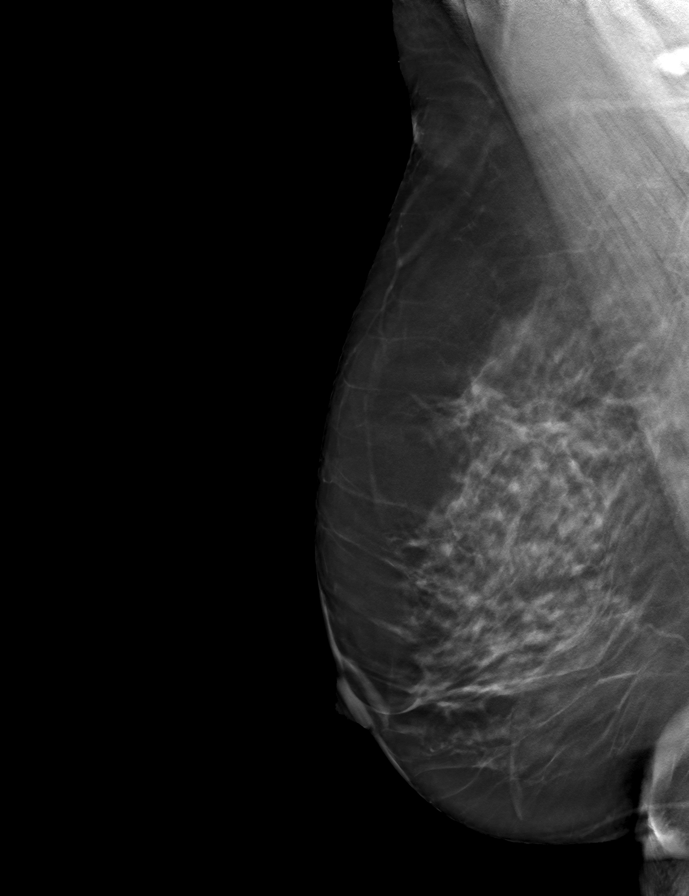

[L MLO tomo · tomo slice 45/88.0]
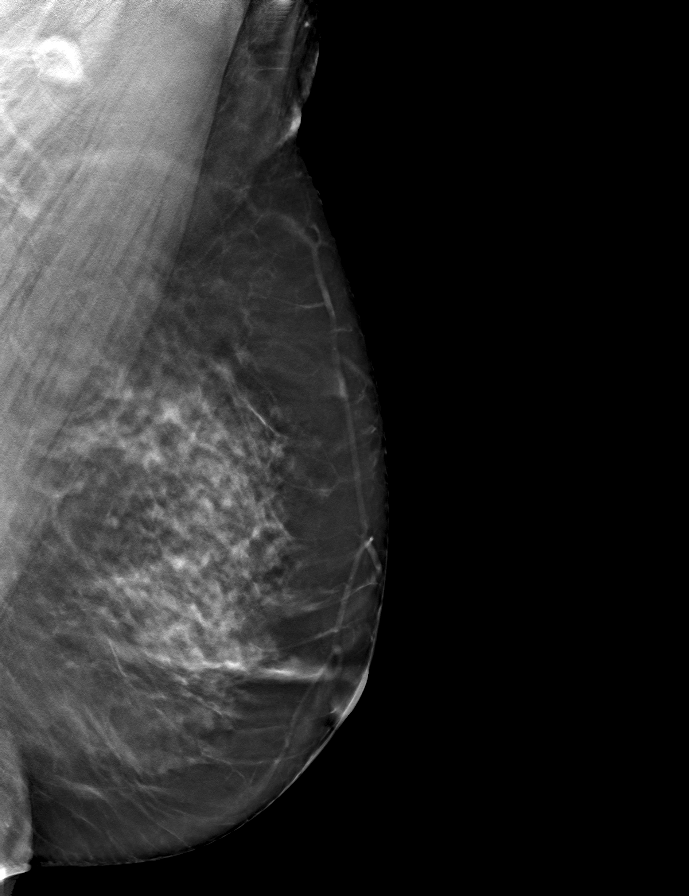

[L CC tomo · tomo slice 40/79.0]
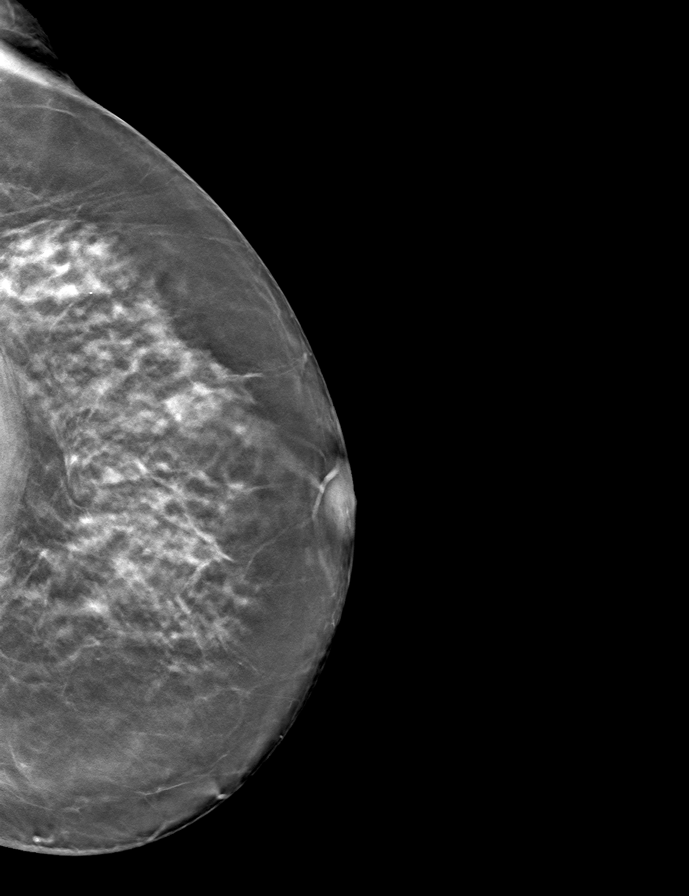

[R CC tomo · tomo slice 37/74.0]
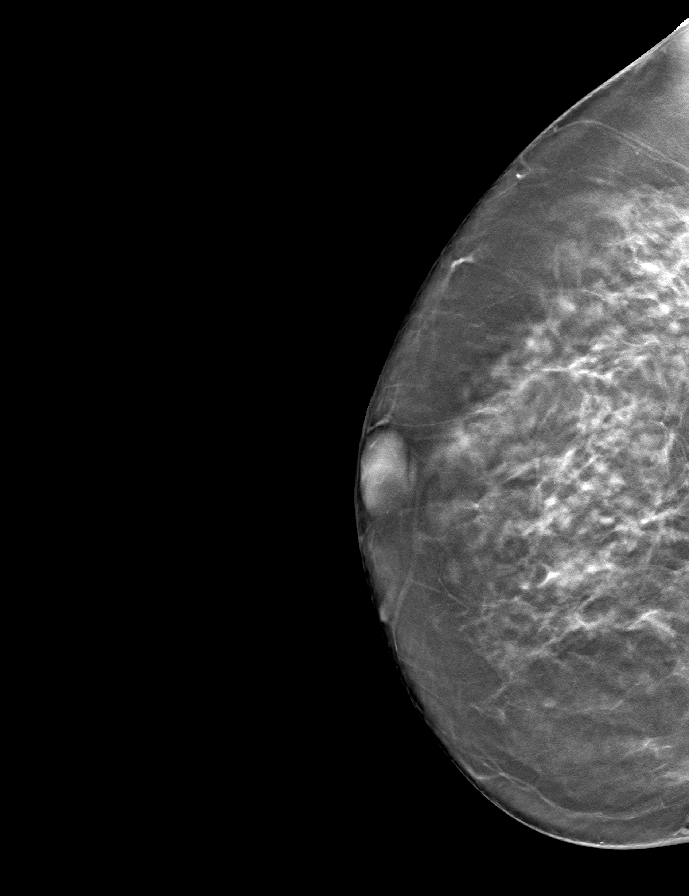

[9 of 24 positions shown; findings below may reference images not displayed]

ACR Breast Density Category c: The breast tissue is heterogeneously
dense, which may obscure small masses.
FINDINGS: There are no findings suspicious for malignancy. Images were
processed with CAD.
IMPRESSION: No mammographic evidence of malignancy. A result letter of this
screening mammogram will be mailed directly to the patient.

RECOMMENDATION:
Screening mammogram in one year. (Code:FT-U-LHB)

BI-RADS CATEGORY  1: Negative.

## 2022-01-18 ENCOUNTER — Ambulatory Visit: Payer: 59 | Admitting: Sports Medicine

## 2022-01-25 ENCOUNTER — Ambulatory Visit: Payer: 59 | Admitting: Sports Medicine

## 2022-02-08 ENCOUNTER — Ambulatory Visit: Payer: 59 | Admitting: Sports Medicine

## 2022-02-16 ENCOUNTER — Ambulatory Visit: Payer: 59 | Admitting: Urology

## 2022-03-23 ENCOUNTER — Ambulatory Visit: Payer: 59 | Admitting: Urology

## 2022-06-05 ENCOUNTER — Ambulatory Visit: Payer: 59 | Admitting: Sports Medicine

## 2022-06-05 VITALS — BP 130/82 | Ht 66.5 in | Wt 170.0 lb

## 2022-06-05 DIAGNOSIS — M7741 Metatarsalgia, right foot: Secondary | ICD-10-CM | POA: Diagnosis not present

## 2022-06-05 DIAGNOSIS — G8929 Other chronic pain: Secondary | ICD-10-CM | POA: Diagnosis not present

## 2022-06-05 DIAGNOSIS — M25511 Pain in right shoulder: Secondary | ICD-10-CM | POA: Diagnosis not present

## 2022-06-05 MED ORDER — IBUPROFEN 800 MG PO TABS: 800.0000 mg | ORAL_TABLET | Freq: Two times a day (BID) | ORAL | 1 refills | Status: DC | PRN

## 2022-06-06 NOTE — Progress Notes (Signed)
   Subjective:    Patient ID: Shannon Watts, female    DOB: 10/28/1960, 62 y.o.   MRN: [id number]  HPI chief complaint: Right foot pain and right shoulder pain  Shannon Watts presents today with a couple of different complaints.  First complaint is right foot pain that she localizes to the plantar aspect of her foot.  It is in the area of her metatarsal heads.  It began after she wore some shoes that did not have good support.  She denies pain on the dorsum of the foot.  She has not noticed any swelling of the dorsum of her foot.  No pain at her heel.  She is also complaining of lateral right shoulder pain this been present for several weeks.  Pain is most noticeable when abducting past 90 degrees.  She is also experiencing pain at night.  She has not noticed any weakness.  No numbness or tingling down this arm.  No recent trauma.    Review of Systems As above    Objective:   Physical Exam  Well-developed, well-nourished.  No acute distress  Right foot: There is some tenderness to palpation on the plantar aspect of the foot centered around the second metatarsal head.  Some mild soft tissue swelling here as well.  There is no tenderness to palpation on the dorsum of the foot.  Negative metatarsal squeeze.  Good pulses.  Right shoulder: Good active and passive range of motion.  Positive painful arc.  Equivocal empty can.  Rotator cuff strength is 5/5.  No tenderness to palpation over the bicipital groove nor over the [facility] joint.  Neurovascularly intact distally.      Assessment & Plan:   Right foot pain secondary to metatarsalgia Right shoulder pain secondary to rotator cuff impingement  For her metatarsalgia, we will give her a green sports insole with a metatarsal pad.  Will also prescribe ibuprofen 800 mg 3 times daily as needed with food as needed.  She has done well with this medication in the past.  For her rotator cuff impingement, she will start a home exercise program specifically  consisting of Jobe rotator cuff exercises and scapular stabilizer exercises.  If symptoms persist then she will return to the office for a subacromial cortisone injection.  Follow-up for ongoing or recalcitrant issues.  This note was dictated using Dragon naturally speaking software and may contain errors in syntax, spelling, or content which have not been identified prior to signing this note.

## 2022-06-11 ENCOUNTER — Other Ambulatory Visit: Payer: Self-pay | Admitting: Obstetrics and Gynecology

## 2022-06-11 DIAGNOSIS — Z1231 Encounter for screening mammogram for malignant neoplasm of breast: Secondary | ICD-10-CM

## 2022-07-20 ENCOUNTER — Ambulatory Visit
Admission: RE | Admit: 2022-07-20 | Discharge: 2022-07-20 | Disposition: A | Discharge status: Home / Self Care | Payer: 59 | Source: Ambulatory Visit | Attending: Obstetrics and Gynecology | Admitting: Obstetrics and Gynecology

## 2022-07-20 DIAGNOSIS — Z1231 Encounter for screening mammogram for malignant neoplasm of breast: Secondary | ICD-10-CM

## 2022-09-12 ENCOUNTER — Other Ambulatory Visit: Payer: Self-pay | Admitting: Sports Medicine

## 2022-09-18 ENCOUNTER — Ambulatory Visit: Payer: 59 | Admitting: Sports Medicine

## 2022-09-18 VITALS — BP 138/88 | Ht 66.0 in | Wt 170.0 lb

## 2022-09-18 DIAGNOSIS — M778 Other enthesopathies, not elsewhere classified: Secondary | ICD-10-CM

## 2022-09-18 MED ORDER — IBUPROFEN 800 MG PO TABS: 800.0000 mg | ORAL_TABLET | Freq: Two times a day (BID) | ORAL | 1 refills | Status: DC | PRN

## 2022-09-19 NOTE — Progress Notes (Signed)
   Subjective:    Patient ID: Shannon Watts, female    DOB: [birth year], 62 y.o.   MRN: [id number]  HPI chief complaint: Right wrist pain  Shannon Watts presents today complaining of 1 week of ulnar-sided wrist pain that began after she was doing some yard work at her [employer].  She denies any trauma at the time.  Pain is most noticeable when pronating or supinating or holding heavy objects.  No numbness or tingling.   Review of Systems As above    Objective:   Physical Exam  Well-developed, well-nourished.  No acute distress  Right wrist: Full wrist range of motion.  No effusion.  No obvious soft tissue swelling.  There is tenderness to palpation along the ulnar aspect of the wrist with reproducible pain with stretching of the ECU tendon.  No tenderness to palpation across the dorsum of the wrist or the radial wrist.  Good strength.  Good pulses.  Limited MSK ultrasound of the ulnar aspect of the right wrist shows swelling as well as a small split through the ECU tendon.      Assessment & Plan:   Right wrist pain secondary to ECU tendinitis with ultrasound evidence of possible small tear versus bifid tendon  We will immobilize for 1 week with a cock up wrist brace.  She will wear this during the day when active.  I will refill her ibuprofen to take as needed as well.  I would look for her symptoms to resolve over the next week or two.  She will follow-up for ongoing or recalcitrant issues.  This note was dictated using Dragon naturally speaking software and may contain errors in syntax, spelling, or content which have not been identified prior to signing this note.

## 2023-03-05 ENCOUNTER — Ambulatory Visit: Payer: 59 | Admitting: Sports Medicine

## 2023-06-18 ENCOUNTER — Encounter: Payer: Self-pay | Admitting: Sports Medicine

## 2023-06-18 ENCOUNTER — Ambulatory Visit (INDEPENDENT_AMBULATORY_CARE_PROVIDER_SITE_OTHER): Payer: 59 | Admitting: Sports Medicine

## 2023-06-18 VITALS — BP 120/82 | Ht 66.0 in | Wt 180.0 lb

## 2023-06-18 DIAGNOSIS — G5712 Meralgia paresthetica, left lower limb: Secondary | ICD-10-CM | POA: Diagnosis not present

## 2023-06-18 NOTE — Progress Notes (Signed)
   Subjective:    Patient ID: Suman Trivedi, female    DOB: Dec 20, 1960, 63 y.o.   MRN: [id number]  HPI chief complaint: Left thigh pain  Cindi presents today with approximately 2 months of intermittent left thigh pain.  No trauma.  She describes the pain as a numb and burning type sensation.  Occasional tingling as well.  Symptoms will come and go but can be quite uncomfortable.  She does feel like her symptoms are improving overall but have not resolved.  Pain does not radiate past the knee.  No low back pain.  No change in clothing.  Interim medical history reviewed Medications reviewed Allergies reviewed    Review of Systems As above    Objective:   Physical Exam  Well-developed, well-nourished.  No acute distress  There is no noticeable atrophy of the left upper leg.  No tenderness to palpation but the lateral thigh just above the knee is numb to touch.  No gross strength deficit.      Assessment & Plan:   Left thigh pain-question meralgia paresthetica  Pain is definitely neuropathic in nature.  Based on his location I suspect that it is irritation of the lateral femoral cutaneous nerve.  Since it seems to be improving overall we are going to take a watchful waiting approach at this time.  Future considerations could be a trial of Neurontin or referral for ultrasound-guided hydrodissection of the lateral femoral cutaneous nerve.  Follow-up for ongoing or recalcitrant issues.  This note was dictated using Dragon naturally speaking software and may contain errors in syntax, spelling, or content which have not been identified prior to signing this note.

## 2023-06-20 ENCOUNTER — Other Ambulatory Visit: Payer: Self-pay | Admitting: Internal Medicine

## 2023-06-20 DIAGNOSIS — Z1231 Encounter for screening mammogram for malignant neoplasm of breast: Secondary | ICD-10-CM

## 2023-07-26 ENCOUNTER — Ambulatory Visit

## 2023-08-02 ENCOUNTER — Ambulatory Visit
Admission: RE | Admit: 2023-08-02 | Discharge: 2023-08-02 | Disposition: A | Discharge status: Home / Self Care | Source: Ambulatory Visit | Attending: Internal Medicine | Admitting: Internal Medicine

## 2023-08-02 DIAGNOSIS — Z1231 Encounter for screening mammogram for malignant neoplasm of breast: Secondary | ICD-10-CM

## 2024-03-25 ENCOUNTER — Other Ambulatory Visit: Payer: Self-pay

## 2024-03-25 ENCOUNTER — Ambulatory Visit: Admitting: Family Medicine

## 2024-03-25 VITALS — BP 138/88 | Ht 66.0 in | Wt 180.0 lb

## 2024-03-25 DIAGNOSIS — M25512 Pain in left shoulder: Secondary | ICD-10-CM | POA: Diagnosis not present

## 2024-03-25 MED ORDER — IBUPROFEN 800 MG PO TABS: 800.0000 mg | ORAL_TABLET | Freq: Two times a day (BID) | ORAL | 1 refills | Status: AC | PRN

## 2024-03-25 NOTE — Patient Instructions (Signed)
 You tore your proximal biceps tendon. Ice 15 minutes at a time 3-4 times a day. Ibuprofen  800mg  three times a day with food for pain and inflammation - take for 7 days then as needed. Simple motion exercises but I wouldn't lift more than 5 pounds with this arm. Follow up with me in 4 weeks - we will discuss home exercises vs physical therapy at that time.

## 2024-03-26 NOTE — Progress Notes (Signed)
 PCP: Henry Ingle, MD  Discussed the use of AI scribe software for clinical note transcription with the patient, who gave verbal consent to proceed.  History of Present Illness Shannon Watts is a 63 year old female who presents with left shoulder pain following an injury.  Left shoulder pain and injury - Onset 4 days ago after feeling a pop in the left shoulder while reaching back in the shower - Immediate swelling and sensation that the shoulder came out of joint at the time of injury - Pain worsened the following day when lifting a chair - Pain is localized to the anterior aspect of the left shoulder - Pain is described as uncomfortable and worsens with sleeping on the left side; avoiding this position provides relief - Reaching behind her back reproduces the original injury and pain - No use of ice or medication for symptom relief - Right-handed  Swelling and physical findings - Visible swelling of the left shoulder - No visible bruising - Concern about arm swelling  Functional limitations - Avoids lifting heavier objects such as her laptop due to pain  Past Medical History:  Diagnosis Date   GERD (gastroesophageal reflux disease)    History of anal fissures    History of kidney stones    PONV (postoperative nausea and vomiting)    Right ureteral stone    Wears contact lenses     Medications Ordered Prior to Encounter[1]  Past Surgical History:  Procedure Laterality Date   BREAST EXCISIONAL BIOPSY Right 2000 and 2004   CYSTOSCOPY WITH RETROGRADE PYELOGRAM, URETEROSCOPY AND STENT PLACEMENT Right 03/14/2015   Procedure: CYSTOSCOPY WITH RETROGRADE PYELOGRAM, URETEROSCOPY, STONE EXTRACTION  AND STENT PLACEMENT;  Surgeon: Belvie LITTIE Clara, MD;  Location: [facility];  Service: Urology;  Laterality: Right;   EUA/  EXCISION ANAL POLYP  04-02-2008   EXCISION  BIOPSY MASS RIGHT BREAST (BENIGN) AND REPAIR ANAL FISSURE  06-12-2002   HOLMIUM LASER  APPLICATION Right 03/14/2015   Procedure: HOLMIUM LASER APPLICATION;  Surgeon: Belvie LITTIE Clara, MD;  Location: [facility];  Service: Urology;  Laterality: Right;    Allergies[2]  BP 138/88   Ht 5' 6 (1.676 m)   Wt 180 lb (81.6 kg)   BMI 29.05 kg/m       No data to display              No data to display              Objective:  Physical Exam:  Gen: NAD, comfortable in exam room  Left shoulder: No swelling, ecchymoses.  No gross deformity. TTP lateral to acromion and over proximal biceps. FROM. Negative Hawkins, Neers. Negative Yergasons.  Positive speeds. Strength 5/5 with empty can and resisted internal/external rotation. Negative apprehension. NV intact distally.   Limited MSK u/s left shoulder: Biceps tendon: proximal biceps tendon is torn with retraction into proximal upper arm. Pec major tendon: intact Subscapularis: intact Infraspinatus: visible portions intact Supraspinatus: difficult to position to fully evaluate - hypoechoic change more proximal within supraspinatus but no obvious tears  Impression: Left proximal biceps tendon rupture Assessment & Plan Left shoulder injury with swelling and discomfort following a popping sensation. Ultrasound confirms proximal biceps tendon rupture.  We discussed conservative vs surgical treatment advising latter done typically for cosmetic reasons because most of strength preserved with this injury.  She would like to proceed with conservative treatment - icing 15 minutes at a time 3-4 times a day. -  ibuprofen  800mg  three times a day with food for 7 days then as needed - motion exercises, lifting restrictions. - follow up in 4 weeks - consider physical therapy at that time     [1]  Current Outpatient Medications on File Prior to Visit  Medication Sig Dispense Refill   acyclovir ointment (ZOVIRAX) 5 % Apply 1 Application topically every 3 (three) hours.     losartan-hydrochlorothiazide  (HYZAAR) 50-12.5 MG tablet Take 1 tablet by mouth daily.     NEXIUM 40 MG capsule Take 40 mg by mouth every morning.      Potassium Citrate  15 MEQ (1620 MG) TBCR TAKE 1 TABLET BY MOUTH EVERY DAY 90 tablet PRN   rosuvastatin (CRESTOR) 10 MG tablet Take 10 mg by mouth daily.     No current facility-administered medications on file prior to visit.  [2]  Allergies Allergen Reactions   Amoxicillin Hives   Penicillins Itching    severe

## 2024-04-22 ENCOUNTER — Ambulatory Visit: Admitting: Family Medicine

## 2024-04-29 ENCOUNTER — Ambulatory Visit: Admitting: Family Medicine

## 2024-04-29 VITALS — BP 126/84 | Ht 66.0 in | Wt 180.0 lb

## 2024-04-29 DIAGNOSIS — S46212D Strain of muscle, fascia and tendon of other parts of biceps, left arm, subsequent encounter: Secondary | ICD-10-CM

## 2024-04-29 NOTE — Patient Instructions (Signed)
 Start strengthening slowly as we discussed. Take ibuprofen  only as needed now. Icing as needed. Message me if you're not improving and want to do physical therapy. Follow up as needed.

## 2024-04-29 NOTE — Progress Notes (Signed)
 PCP: Henry Ingle, MD  Discussed the use of AI scribe software for clinical note transcription with the patient, who gave verbal consent to proceed.  History of Present Illness Shannon Watts is a 64 year old female with left proximal biceps tendon rupture who presents for follow-up evaluation of left arm pain and functional recovery.  Left Arm Pain and Swelling: - Five weeks post left proximal biceps tendon rupture - Initial symptoms included severe pain, swelling, and ecchymosis - Current pain is mild soreness localized to the left biceps - No current severe pain  Functional Limitations: - Limits heavy lifting with the left arm - Uses right hand for carrying heavier items - Able to perform most daily activities - Cautious about muscle endurance and overuse  Cosmetic Concerns: - Concerned about possible cosmetic deformity due to family history (mother with similar muscle drooping) - No significant contour change currently observed  Analgesic Use: - Takes ibuprofen  as needed, typically one tablet daily - Adequate pain control with current regimen - No need for other analgesics    Past Medical History:  Diagnosis Date   GERD (gastroesophageal reflux disease)    History of anal fissures    History of kidney stones    PONV (postoperative nausea and vomiting)    Right ureteral stone    Wears contact lenses     Medications Ordered Prior to Encounter[1]  Past Surgical History:  Procedure Laterality Date   BREAST EXCISIONAL BIOPSY Right 2000 and 2004   CYSTOSCOPY WITH RETROGRADE PYELOGRAM, URETEROSCOPY AND STENT PLACEMENT Right 03/14/2015   Procedure: CYSTOSCOPY WITH RETROGRADE PYELOGRAM, URETEROSCOPY, STONE EXTRACTION  AND STENT PLACEMENT;  Surgeon: Belvie LITTIE Clara, MD;  Location: [facility];  Service: Urology;  Laterality: Right;   EUA/  EXCISION ANAL POLYP  04-02-2008   EXCISION  BIOPSY MASS RIGHT BREAST (BENIGN) AND REPAIR ANAL FISSURE   06-12-2002   HOLMIUM LASER APPLICATION Right 03/14/2015   Procedure: HOLMIUM LASER APPLICATION;  Surgeon: Belvie LITTIE Clara, MD;  Location: [facility];  Service: Urology;  Laterality: Right;    Allergies[2]  BP 126/84   Ht 5' 6 (1.676 m)   Wt 180 lb (81.6 kg)   BMI 29.05 kg/m       No data to display              No data to display              Objective:  Physical Exam:  Gen: NAD, comfortable in exam room  Left shoulder: No swelling, ecchymoses.  Mild popeye deformity. Minimal TTP proximal biceps muscle. FROM. Strength 5/5 with elbow flexion. NV intact distally. Assessment & Plan Left proximal biceps tendon rupture Five weeks post-injury with improving pain and function, preserved gross strength, and no significant cosmetic deformity. Surgery not indicated due to preserved strength and functional improvement. Mild cosmetic changes may develop. - Advised gradual return to activity with light weights and low repetitions, increasing as tolerated. - Instructed to avoid consecutive days of arm exercises and to progress weight and repetitions gradually. - Discussed optional physical therapy for structured rehabilitation and endurance. - Advised to monitor for plateau in progress or persistent symptoms and return for evaluation if these occur. - Recommended continued use of ibuprofen  as needed for analgesia. - Reinforced that surgical intervention is not indicated.     [1]  Current Outpatient Medications on File Prior to Visit  Medication Sig Dispense Refill   acyclovir ointment (ZOVIRAX) 5 % Apply  1 Application topically every 3 (three) hours.     ibuprofen  (ADVIL ) 800 MG tablet Take 1 tablet (800 mg total) by mouth 2 (two) times daily as needed. Take with food. 60 tablet 1   losartan-hydrochlorothiazide (HYZAAR) 50-12.5 MG tablet Take 1 tablet by mouth daily.     NEXIUM 40 MG capsule Take 40 mg by mouth every morning.      Potassium Citrate  15  MEQ (1620 MG) TBCR TAKE 1 TABLET BY MOUTH EVERY DAY 90 tablet PRN   rosuvastatin (CRESTOR) 10 MG tablet Take 10 mg by mouth daily.     No current facility-administered medications on file prior to visit.  [2]  Allergies Allergen Reactions   Amoxicillin Hives   Penicillins Itching    severe

## 2024-05-21 ENCOUNTER — Other Ambulatory Visit: Payer: Self-pay | Admitting: Family Medicine
